# Patient Record
Sex: Male | Born: 1960 | Race: White | Hispanic: No | Marital: Single | Smoking: Former smoker
Health system: Southern US, Community
[De-identification: ages and names within clinical notes are randomized; demographics above are authoritative.]

## PROBLEM LIST (undated history)

## (undated) DIAGNOSIS — M1990 Unspecified osteoarthritis, unspecified site: Secondary | ICD-10-CM

## (undated) DIAGNOSIS — J45909 Unspecified asthma, uncomplicated: Secondary | ICD-10-CM

## (undated) DIAGNOSIS — M5412 Radiculopathy, cervical region: Secondary | ICD-10-CM

## (undated) DIAGNOSIS — I10 Essential (primary) hypertension: Secondary | ICD-10-CM

## (undated) DIAGNOSIS — F32A Depression, unspecified: Secondary | ICD-10-CM

## (undated) DIAGNOSIS — E669 Obesity, unspecified: Secondary | ICD-10-CM

## (undated) HISTORY — DX: Unspecified osteoarthritis, unspecified site: M19.90

## (undated) HISTORY — PX: EPIDURAL BLOCK INJECTION: SHX1516

## (undated) HISTORY — DX: Essential (primary) hypertension: I10

## (undated) HISTORY — PX: APPENDECTOMY: SHX54

## (undated) HISTORY — DX: Unspecified asthma, uncomplicated: J45.909

## (undated) HISTORY — DX: Depression, unspecified: F32.A

---

## 2011-08-31 DIAGNOSIS — M25561 Pain in right knee: Secondary | ICD-10-CM | POA: Insufficient documentation

## 2017-10-27 ENCOUNTER — Ambulatory Visit (INDEPENDENT_AMBULATORY_CARE_PROVIDER_SITE_OTHER): Payer: Commercial Managed Care - PPO

## 2017-10-27 ENCOUNTER — Other Ambulatory Visit: Payer: Self-pay

## 2017-10-27 ENCOUNTER — Encounter: Payer: Self-pay | Admitting: Emergency Medicine

## 2017-10-27 ENCOUNTER — Ambulatory Visit
Admission: EM | Admit: 2017-10-27 | Discharge: 2017-10-27 | Disposition: A | Discharge status: Home / Self Care | Payer: Commercial Managed Care - PPO | Attending: Emergency Medicine | Admitting: Emergency Medicine

## 2017-10-27 DIAGNOSIS — N12 Tubulo-interstitial nephritis, not specified as acute or chronic: Secondary | ICD-10-CM | POA: Insufficient documentation

## 2017-10-27 DIAGNOSIS — R531 Weakness: Secondary | ICD-10-CM | POA: Insufficient documentation

## 2017-10-27 DIAGNOSIS — B349 Viral infection, unspecified: Secondary | ICD-10-CM | POA: Diagnosis not present

## 2017-10-27 DIAGNOSIS — Z87442 Personal history of urinary calculi: Secondary | ICD-10-CM | POA: Insufficient documentation

## 2017-10-27 DIAGNOSIS — R062 Wheezing: Secondary | ICD-10-CM | POA: Insufficient documentation

## 2017-10-27 DIAGNOSIS — Z79899 Other long term (current) drug therapy: Secondary | ICD-10-CM | POA: Diagnosis not present

## 2017-10-27 DIAGNOSIS — M545 Low back pain: Secondary | ICD-10-CM | POA: Diagnosis not present

## 2017-10-27 DIAGNOSIS — I493 Ventricular premature depolarization: Secondary | ICD-10-CM | POA: Diagnosis not present

## 2017-10-27 DIAGNOSIS — R42 Dizziness and giddiness: Secondary | ICD-10-CM | POA: Insufficient documentation

## 2017-10-27 DIAGNOSIS — Z809 Family history of malignant neoplasm, unspecified: Secondary | ICD-10-CM | POA: Insufficient documentation

## 2017-10-27 DIAGNOSIS — F17210 Nicotine dependence, cigarettes, uncomplicated: Secondary | ICD-10-CM | POA: Insufficient documentation

## 2017-10-27 DIAGNOSIS — Z791 Long term (current) use of non-steroidal anti-inflammatories (NSAID): Secondary | ICD-10-CM | POA: Insufficient documentation

## 2017-10-27 DIAGNOSIS — M791 Myalgia, unspecified site: Secondary | ICD-10-CM | POA: Diagnosis not present

## 2017-10-27 DIAGNOSIS — I700 Atherosclerosis of aorta: Secondary | ICD-10-CM | POA: Diagnosis not present

## 2017-10-27 DIAGNOSIS — R110 Nausea: Secondary | ICD-10-CM | POA: Diagnosis not present

## 2017-10-27 DIAGNOSIS — R0602 Shortness of breath: Secondary | ICD-10-CM | POA: Diagnosis not present

## 2017-10-27 DIAGNOSIS — R0981 Nasal congestion: Secondary | ICD-10-CM | POA: Insufficient documentation

## 2017-10-27 DIAGNOSIS — R002 Palpitations: Secondary | ICD-10-CM | POA: Insufficient documentation

## 2017-10-27 DIAGNOSIS — R079 Chest pain, unspecified: Secondary | ICD-10-CM | POA: Diagnosis not present

## 2017-10-27 DIAGNOSIS — Z87440 Personal history of urinary (tract) infections: Secondary | ICD-10-CM | POA: Insufficient documentation

## 2017-10-27 DIAGNOSIS — R61 Generalized hyperhidrosis: Secondary | ICD-10-CM | POA: Diagnosis not present

## 2017-10-27 DIAGNOSIS — R05 Cough: Secondary | ICD-10-CM | POA: Diagnosis not present

## 2017-10-27 LAB — URINALYSIS, COMPLETE (UACMP) WITH MICROSCOPIC
BILIRUBIN URINE: NEGATIVE
GLUCOSE, UA: NEGATIVE mg/dL
HGB URINE DIPSTICK: NEGATIVE
Leukocytes, UA: NEGATIVE
NITRITE: NEGATIVE
PROTEIN: 100 mg/dL — AB
RBC / HPF: NONE SEEN RBC/hpf (ref 0–5)
Specific Gravity, Urine: 1.02 (ref 1.005–1.030)
pH: 5.5 (ref 5.0–8.0)

## 2017-10-27 LAB — COMPREHENSIVE METABOLIC PANEL
ALT: 22 U/L (ref 0–44)
AST: 27 U/L (ref 15–41)
Albumin: 4.5 g/dL (ref 3.5–5.0)
Alkaline Phosphatase: 63 U/L (ref 38–126)
Anion gap: 15 (ref 5–15)
BILIRUBIN TOTAL: 1.3 mg/dL — AB (ref 0.3–1.2)
BUN: 17 mg/dL (ref 6–20)
CO2: 23 mmol/L (ref 22–32)
CREATININE: 1.09 mg/dL (ref 0.61–1.24)
Calcium: 10 mg/dL (ref 8.9–10.3)
Chloride: 102 mmol/L (ref 98–111)
GFR calc Af Amer: >60 mL/min (ref >60)
Glucose, Bld: 126 mg/dL — ABNORMAL HIGH (ref 70–99)
POTASSIUM: 4.4 mmol/L (ref 3.5–5.1)
Sodium: 140 mmol/L (ref 135–145)
Total Protein: 7.7 g/dL (ref 6.5–8.1)

## 2017-10-27 LAB — CBC WITH DIFFERENTIAL/PLATELET
BASOS ABS: 0.1 10*3/uL (ref 0–0.1)
Basophils Relative: 1 %
EOS ABS: 0.1 10*3/uL (ref 0–0.7)
EOS PCT: 1 %
HCT: 49.6 % (ref 40.0–52.0)
Hemoglobin: 16.7 g/dL (ref 13.0–18.0)
LYMPHS ABS: 1.5 10*3/uL (ref 1.0–3.6)
LYMPHS PCT: 23 %
MCH: 33.9 pg (ref 26.0–34.0)
MCHC: 33.6 g/dL (ref 32.0–36.0)
MCV: 100.7 fL — ABNORMAL HIGH (ref 80.0–100.0)
MONO ABS: 0.4 10*3/uL (ref 0.2–1.0)
Monocytes Relative: 7 %
Neutro Abs: 4.4 10*3/uL (ref 1.4–6.5)
Neutrophils Relative %: 68 %
PLATELETS: 209 10*3/uL (ref 150–440)
RBC: 4.93 MIL/uL (ref 4.40–5.90)
RDW: 14.7 % — AB (ref 11.5–14.5)
WBC: 6.4 10*3/uL (ref 3.8–10.6)

## 2017-10-27 IMAGING — CR DG CHEST 2V
2 series · 2 of 2 positions shown · non-contrast
Comparison: None in PACs

CLINICAL DATA: Shortness of breath at rest and with exertion,
nonproductive cough, body aches, fever, nausea, and left-sided chest
pain for the past 3 days. Current smoker.

EXAM:
CHEST - 2 VIEW

[chest pa]
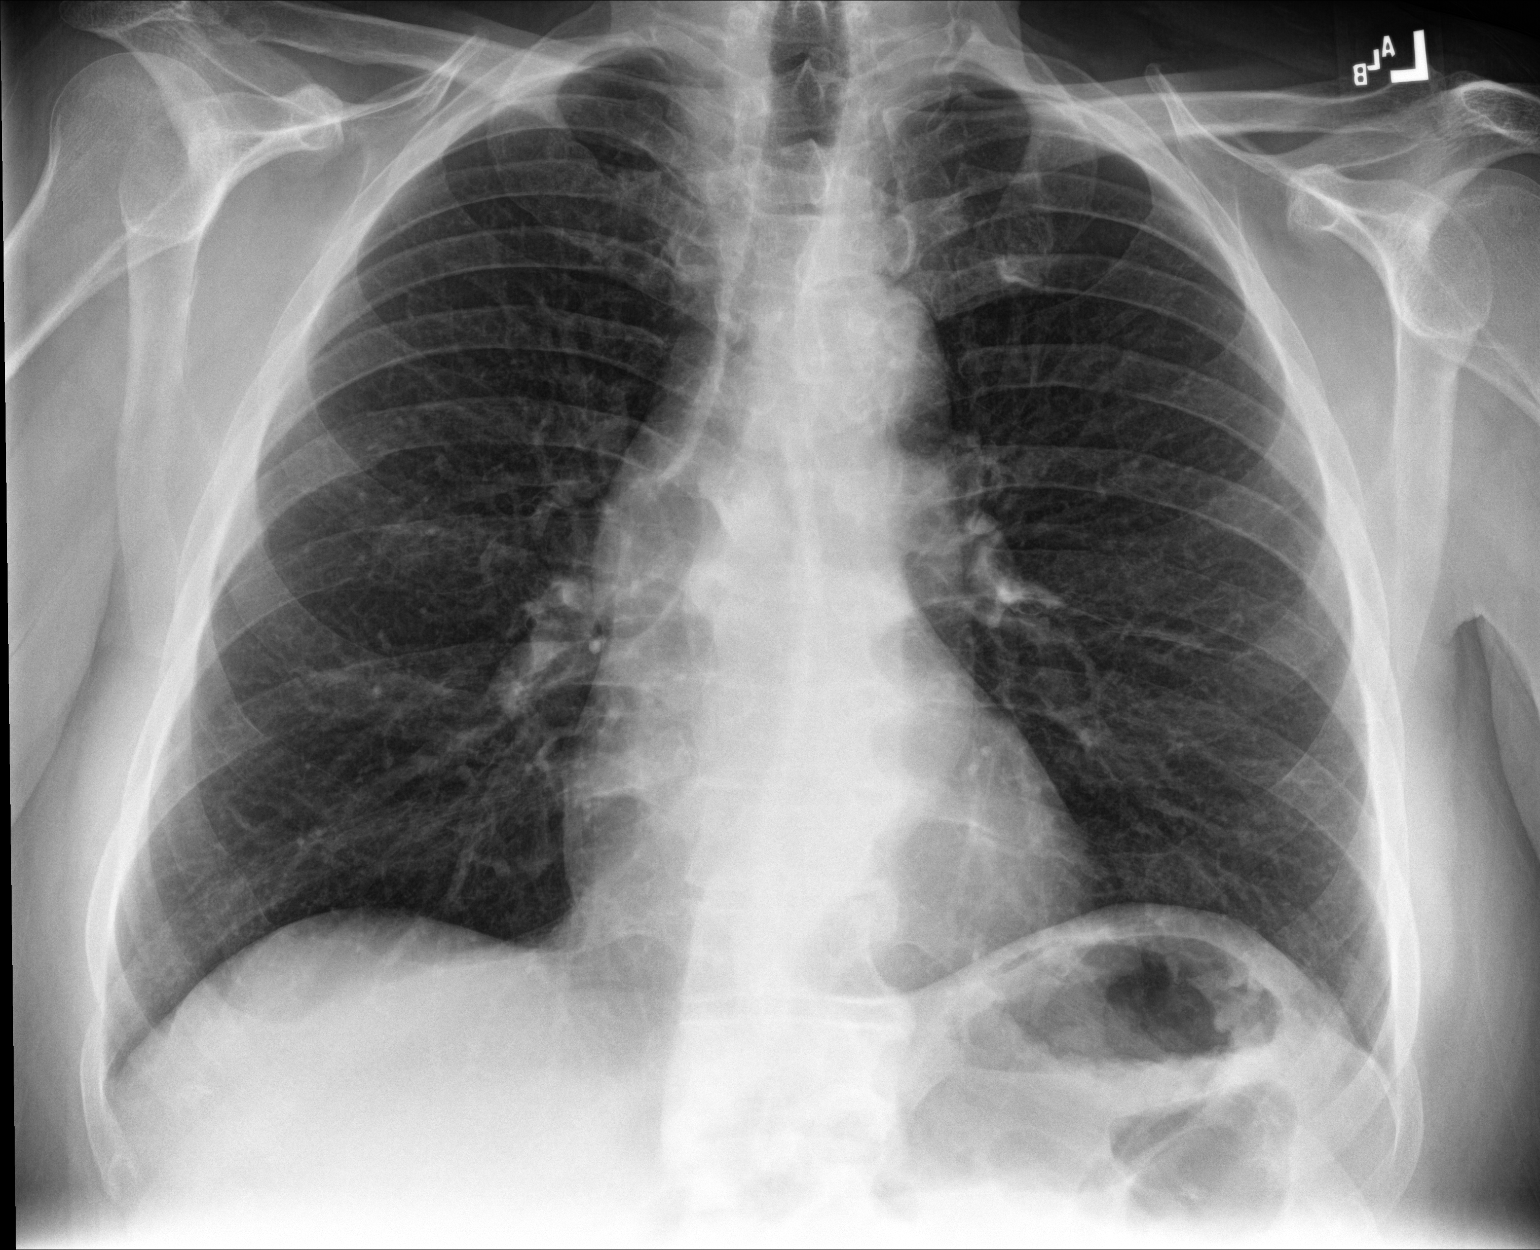

[chest lat]
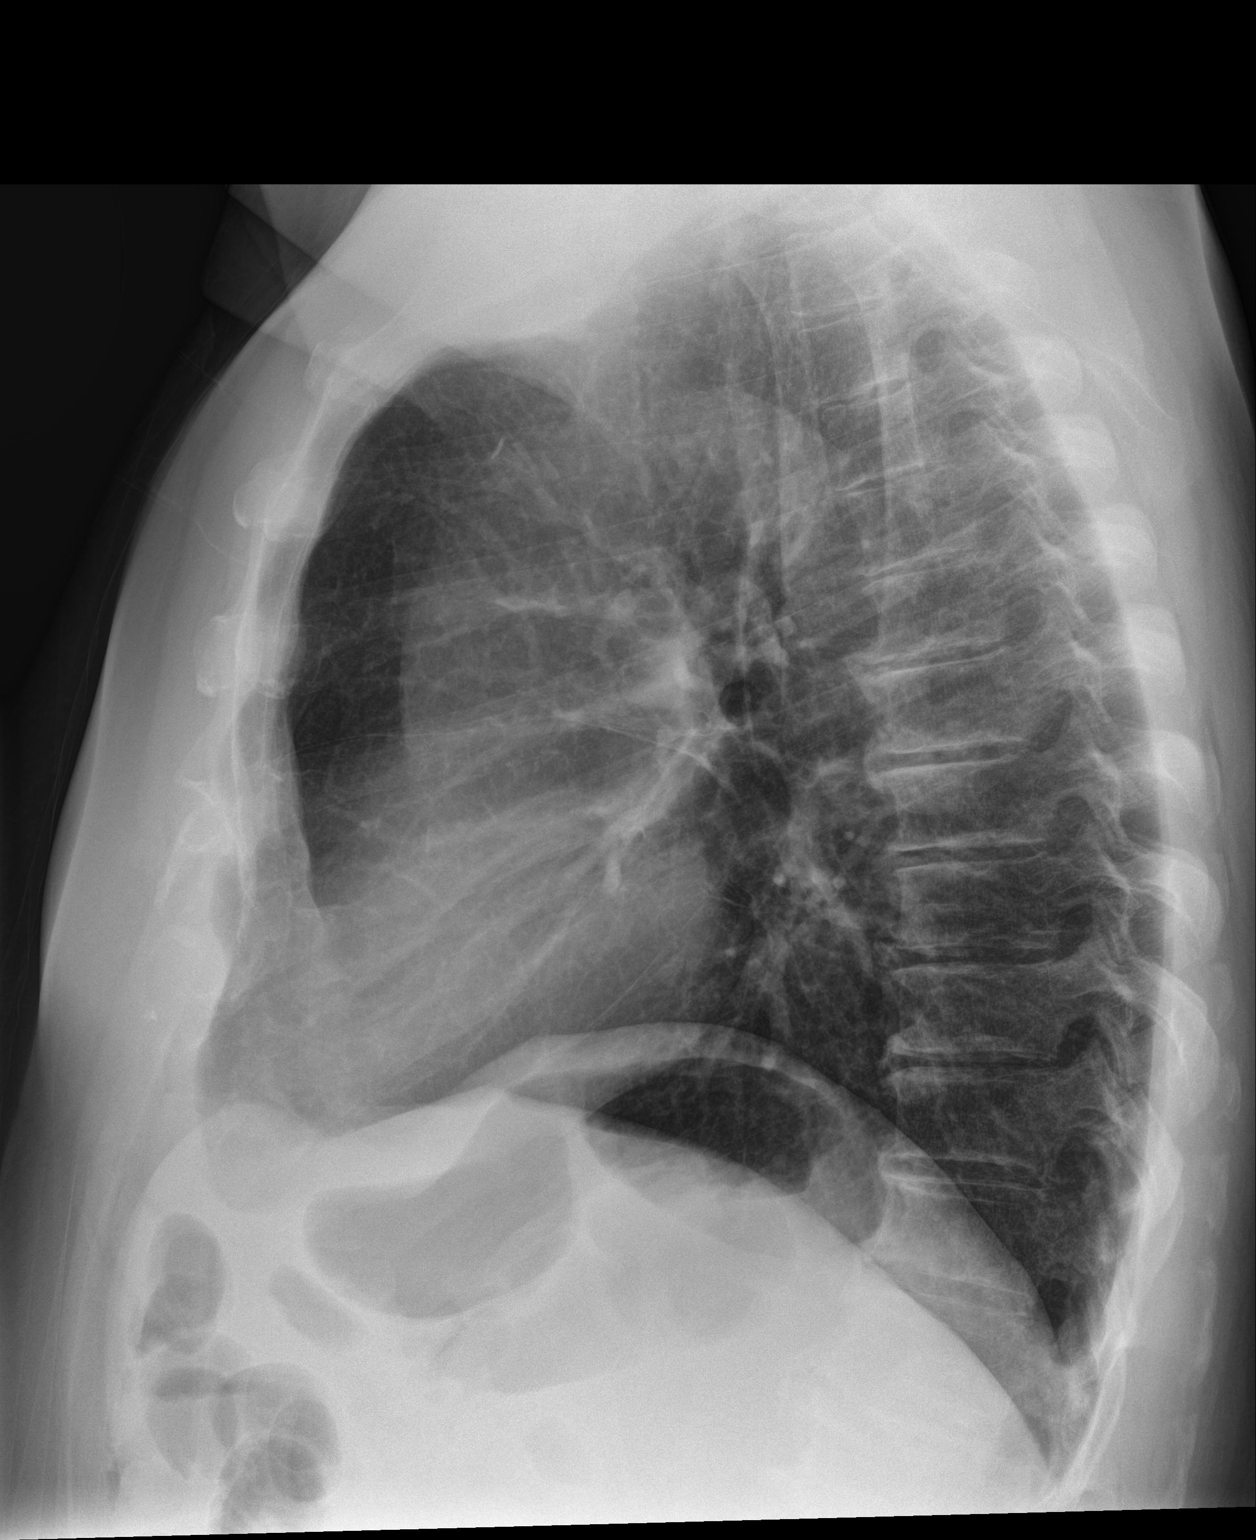

[2 of 2 positions shown; findings below may reference images not displayed]

FINDINGS: The lungs are adequately inflated and clear. The heart and pulmonary
vascularity are normal. The mediastinum is normal in width. There is
no pleural effusion. There is calcification in the wall of the
aortic arch. The bony thorax exhibits no acute abnormality.
IMPRESSION: There is no acute cardiopulmonary abnormality.

Thoracic aortic atherosclerosis.

## 2017-10-27 MED ORDER — IBUPROFEN 600 MG PO TABS: 600.0000 mg | ORAL_TABLET | Freq: Four times a day (QID) | ORAL | 0 refills | Status: DC | PRN

## 2017-10-27 MED ORDER — ALBUTEROL SULFATE HFA 108 (90 BASE) MCG/ACT IN AERS: 1.0000 | INHALATION_SPRAY | Freq: Four times a day (QID) | RESPIRATORY_TRACT | 0 refills | Status: AC | PRN

## 2017-10-27 MED ORDER — AEROCHAMBER PLUS MISC: 2 refills | Status: AC

## 2017-10-27 NOTE — Discharge Instructions (Addendum)
Your EKG, chest x-ray, lab work came back normal.  We are sending your urine off for culture to make absolutely sure that you do not have a urinary tract infection.  We will call in the appropriate antibiotics if you do have a UTI.  Ibuprofen 600 mg with 1 g of Tylenol together 3 or 4 times a day as needed for body aches.  Push electrolyte containing fluids such as Pedialyte or Gatorade.  You can try 2 puffs from your albuterol inhaler using your spacer every 4-6 hours as needed for coughing, shortness of breath.  Decrease your salt intake. diet and exercise will lower your blood pressure significantly. It is important to keep your blood pressure under good control, as having a elevated blood pressure for prolonged periods of time significantly increases your risk of stroke, heart attacks, kidney damage, eye damage, and other problems. Measure your blood pressure once a day, preferably at the same time every day. Keep a log of this and bring it to your next doctor's appointment.  Bring your blood pressure cuff as well.  Return here in a week or two for blood pressure recheck if you're unable to find a primary care physician by then. Return immediately to the ER if you start having chest pain, headache, problems seeing, problems talking, problems walking, if you feel like you're about to pass out, if you do pass out, if you have a seizure, or for any other concerns.  Here is a list of primary care providers who are taking new patients:  Dr. Elizabeth Sauereanna Jones, Dr. Schuyler AmorWilliam Plonk [address] [place] [address] [phone]  [facility] [place] [address]  [place] [address]  [phone]  [facility] [address]  [place], [place] [address] [phone]  [facility] [place] [address]  [phone] [place], [place] [address]  Here are clinics/ other resources who will see you if you do not have insurance. Some have certain criteria that you must meet. Call  them and find out what they are:  [facility]: [address] [place], [place] [address] Phone: [phone] Hours: First and Third Saturdays of each Month, 9 a.m. - 1 p.m.  [facility]: [address], [place], [place] [address] Phone: [phone] Hours: Tuesday, 4 p.m. - 8 p.m. Thursday, 1 p.m. - 8 p.m. Wednesday, 9 a.m. - [name]  [facility] [address], [place], [place] [address] Phone: [phone] Pharmacy Phone Number: [phone] Dental Phone Number: [phone] [facility] Insurance Help: [phone]  Dental Hours: Monday - Thursday, 8 a.m. - 6 p.m.  [facility] [address] [place], [place] [address] Phone: [phone] Pharmacy Phone Number: [phone] [facility] Insurance Help: [phone]  [facility] [address] [place], [place] [address] Phone: [phone] Pharmacy Phone Number: [phone] [facility] Insurance Help: [phone]  [facility] [address] [place], [place] [address] Phone: [phone] [facility] Insurance Help: [phone]   [facility] [address] [place], [place] [address] Phone: [phone]  Go to www.goodrx.com to look up your medications. This will give you a list of where you can find your prescriptions at the most affordable prices. Or ask the pharmacist what the cash price is, or if they have any other discount programs available to help make your medication more affordable. This can be less expensive than what you would pay with insurance.

## 2017-10-27 NOTE — ED Provider Notes (Signed)
HPI  SUBJECTIVE:  Keith Rogers is a 57 y.o. male who presents with 2 days of generalized weakness, left-sided entire body aches described as soreness, diaphoresis.  He reports feeling feverish but has not taken his temperature.  Reports some nasal congestion/rhinorrhea, occasional dry cough, intermittent shortness of breath, dyspnea on exertion.  He reports constant left lower back pain described as soreness, intermittent episodes of dizziness, lightheadedness that seems to be associated with positional change.  States that this dizziness lasts a few minutes and then resolves.  He denies vertigo.  No sore throat, ear pain, sinus pain or pressure, neck stiffness, headache, rash, wheezing, chest pressure or heaviness.  He reports reports entire left sided chest soreness that seems to be part of his left-sided body aches.  No abdominal pain, urinary complaints.  No vomiting, diarrhea.  No tick bite, or spending time in the woods.  No sick contacts with similar symptoms.  No antibiotics in the past month.  He has been taking Aleve, cough syrup and over-the-counter cold medications with some improvement in his symptoms.  Last dose of Aleve was within 6 to 8 hours of evaluation.  No aggravating factors.  He has a past medical history of UTI, pyelonephritis, nephrolithiasis.  He is a smoker.  No history of diabetes, hypertension, asthma, eczema, COPD, PE, DVT, cancer.  PMD: None.  History reviewed. No pertinent past medical history.  History reviewed. No pertinent surgical history.  Family History  Problem Relation Age of Onset  . Healthy Mother   . Cancer Father        agent orange    Social History   Tobacco Use  . Smoking status: Current Every Day Smoker    Packs/day: 1.00    Types: Cigarettes  . Smokeless tobacco: Never Used  Substance Use Topics  . Alcohol use: Yes    Comment: occassionally  . Drug use: Never    No current facility-administered medications for this encounter.   Current  Outpatient Medications:  .  albuterol (PROVENTIL HFA;VENTOLIN HFA) 108 (90 Base) MCG/ACT inhaler, Inhale 1-2 puffs into the lungs every 6 (six) hours as needed for wheezing or shortness of breath., Disp: 1 Inhaler, Rfl: 0 .  ibuprofen (ADVIL,MOTRIN) 600 MG tablet, Take 1 tablet (600 mg total) by mouth every 6 (six) hours as needed., Disp: 30 tablet, Rfl: 0 .  Spacer/Aero-Holding Chambers (AEROCHAMBER PLUS) inhaler, Use as instructed, Disp: 1 each, Rfl: 2  No Known Allergies   ROS  As noted in HPI.   Physical Exam  BP (!) 162/105 (BP Location: Left Arm)   Pulse (!) 104   Temp 98.1 F (36.7 C) (Oral)   Resp 16   Ht 6' (1.829 m)   Wt 270 lb (122.5 kg)   SpO2 100%   BMI 36.62 kg/m   Constitutional: Well developed, well nourished, no acute distress Eyes: PERRL, EOMI, conjunctiva normal bilaterally HENT: Normocephalic, atraumatic,mucus membranes moist.  No nasal congestion.  Normal turbinates.  No sinus tenderness. Neck: No meningismus Respiratory: Clear to auscultation bilaterally, no rales, no rhonchi.  Wheezes bilateral bases.  No chest wall tenderness Cardiovascular: Regular tachycardia, no murmurs, no gallops, no rubs GI: Soft, nondistended, normal bowel sounds, nontender, no rebound, no guarding.  Negative Murphy, negative McBurney Back: Left-sided CVAT skin: No rash, skin intact Musculoskeletal: Trace bilateral edema lower extremities, calves symmetric, no tenderness, no deformities Neurologic: Alert & oriented x 3, CN II-XII grossly intact, no motor deficits, sensation grossly intact Psychiatric: Speech and behavior appropriate  ED Course   Medications - No data to display  Orders Placed This Encounter  Procedures  . Urine culture    Standing Status:   Standing    Number of Occurrences:   1  . DG Chest 2 View    Standing Status:   Standing    Number of Occurrences:   1    Order Specific Question:   Reason for Exam (SYMPTOM  OR DIAGNOSIS REQUIRED)    Answer:    r/o PNA, pulm edema, pleural effusion  . Comprehensive metabolic panel    Standing Status:   Standing    Number of Occurrences:   1  . Urinalysis, Complete w Microscopic    Standing Status:   Standing    Number of Occurrences:   1  . CBC with Differential    Standing Status:   Standing    Number of Occurrences:   1  . ED EKG    Standing Status:   Standing    Number of Occurrences:   1    Order Specific Question:   Reason for Exam    Answer:   Weakness  . EKG 12-Lead    Standing Status:   Standing    Number of Occurrences:   1   Results for orders placed or performed during the hospital encounter of 10/27/17 (from the past 24 hour(s))  Comprehensive metabolic panel     Status: Abnormal   Collection Time: 10/27/17  1:56 PM  Result Value Ref Range   Sodium 140 135 - 145 mmol/L   Potassium 4.4 3.5 - 5.1 mmol/L   Chloride 102 98 - 111 mmol/L   CO2 23 22 - 32 mmol/L   Glucose, Bld 126 (H) 70 - 99 mg/dL   BUN 17 6 - 20 mg/dL   Creatinine, Ser 4.09 0.61 - 1.24 mg/dL   Calcium 81.1 8.9 - 91.4 mg/dL   Total Protein 7.7 6.5 - 8.1 g/dL   Albumin 4.5 3.5 - 5.0 g/dL   AST 27 15 - 41 U/L   ALT 22 0 - 44 U/L   Alkaline Phosphatase 63 38 - 126 U/L   Total Bilirubin 1.3 (H) 0.3 - 1.2 mg/dL   GFR calc non Af Amer >60 >60 mL/min   GFR calc Af Amer >60 >60 mL/min   Anion gap 15 5 - 15  Urinalysis, Complete w Microscopic     Status: Abnormal   Collection Time: 10/27/17  1:56 PM  Result Value Ref Range   Color, Urine YELLOW YELLOW   APPearance HAZY (A) CLEAR   Specific Gravity, Urine 1.020 1.005 - 1.030   pH 5.5 5.0 - 8.0   Glucose, UA NEGATIVE NEGATIVE mg/dL   Hgb urine dipstick NEGATIVE NEGATIVE   Bilirubin Urine NEGATIVE NEGATIVE   Ketones, ur TRACE (A) NEGATIVE mg/dL   Protein, ur 782 (A) NEGATIVE mg/dL   Nitrite NEGATIVE NEGATIVE   Leukocytes, UA NEGATIVE NEGATIVE   Squamous Epithelial / LPF 0-5 0 - 5   WBC, UA 0-5 0 - 5 WBC/hpf   RBC / HPF NONE SEEN 0 - 5 RBC/hpf   Bacteria,  UA FEW (A) NONE SEEN   Mucus PRESENT    Hyaline Casts, UA PRESENT   CBC with Differential     Status: Abnormal   Collection Time: 10/27/17  1:56 PM  Result Value Ref Range   WBC 6.4 3.8 - 10.6 K/uL   RBC 4.93 4.40 - 5.90 MIL/uL   Hemoglobin 16.7 13.0 -  18.0 g/dL   HCT 95.649.6 21.340.0 - 08.652.0 %   MCV 100.7 (H) 80.0 - 100.0 fL   MCH 33.9 26.0 - 34.0 pg   MCHC 33.6 32.0 - 36.0 g/dL   RDW 57.814.7 (H) 46.911.5 - 62.914.5 %   Platelets 209 150 - 440 K/uL   Neutrophils Relative % 68 %   Neutro Abs 4.4 1.4 - 6.5 K/uL   Lymphocytes Relative 23 %   Lymphs Abs 1.5 1.0 - 3.6 K/uL   Monocytes Relative 7 %   Monocytes Absolute 0.4 0.2 - 1.0 K/uL   Eosinophils Relative 1 %   Eosinophils Absolute 0.1 0 - 0.7 K/uL   Basophils Relative 1 %   Basophils Absolute 0.1 0 - 0.1 K/uL   Dg Chest 2 View  Result Date: 10/27/2017 CLINICAL DATA:  Shortness of breath at rest and with exertion, nonproductive cough, body aches, fever, nausea, and left-sided chest pain for the past 3 days. Current smoker. EXAM: CHEST - 2 VIEW COMPARISON:  None in PACs FINDINGS: The lungs are adequately inflated and clear. The heart and pulmonary vascularity are normal. The mediastinum is normal in width. There is no pleural effusion. There is calcification in the wall of the aortic arch. The bony thorax exhibits no acute abnormality. IMPRESSION: There is no acute cardiopulmonary abnormality. Thoracic aortic atherosclerosis. Electronically Signed   By: David  Swazil[place] M.D.   On: 10/27/2017 13:50    ED Clinical Impression  Viral syndrome   ED Assessment/Plan  Given the CVA tenderness and history of pyelonephritis/UTIs, will check urine.  We will also check chest x-ray because of the wheezing in bilateral bases, history of intermittent shortness of breath/dyspnea on exertion.  He is a smoker.  Also checking CBC, CMP, EKG.  Doubt PE.  Blood pressure noted.  No previous measurements for comparison.  Pt denies any CNS type sx such as HA, visual  changes, focal paresis, or new onset seizure activity.  While patient reports chest soreness, intermittent palpitations, pt denies any CV sx such as pedal edema, tearing pain radiating to back or abd. Pt denied any renal sx such as anuria or hematuria.  Patient has been using OTC cold medications with decongestants could explain his elevated blood pressure.  No evidence of a hypertensive emergency.  EKG: Normal sinus rhythm with single PVC.  Rate 95.  Normal axis, normal intervals.  No hypertrophy.  No previous EKG for comparison.  Reviewed imaging independently.  No pneumonia.  Thoracic aortic atherosclerosis per radiology. See radiology report for full details.  Labs reviewed.  Patient has few bacteria in his urine, but negative esterase, nitrites.  We will send this off for culture prior to initiating treatment for possible UTI.  His CBC, CMP are unremarkable.  Unsure as to the etiology of patient's symptoms, suspect viral process.  No evidence of a life-threatening emergency at this time.  Will send him home with ibuprofen 600 mg to take with 1 g of Tylenol together 3 or 4 times a day.  Discontinue all other cold medicines.  May use guaifenesin as needed for nasal congestion.  Can try an albuterol inhaler with a spacer for the cough, dyspnea.  patient states that he is sleeping well at night, do not think that he requires narcotic cough syrup.  We will have him push fluids, and refer him to a primary care physician for further blood pressure monitoring.  Will advise him to keep a log of his blood pressure.  Discussed labs, imaging, MDM,  treatment plan, and plan for follow-up with patient Discussed sn/sx that should prompt return to the ED. patient agrees with plan.   Meds ordered this encounter  Medications  . ibuprofen (ADVIL,MOTRIN) 600 MG tablet    Sig: Take 1 tablet (600 mg total) by mouth every 6 (six) hours as needed.    Dispense:  30 tablet    Refill:  0  . albuterol (PROVENTIL  HFA;VENTOLIN HFA) 108 (90 Base) MCG/ACT inhaler    Sig: Inhale 1-2 puffs into the lungs every 6 (six) hours as needed for wheezing or shortness of breath.    Dispense:  1 Inhaler    Refill:  0  . Spacer/Aero-Holding Chambers (AEROCHAMBER PLUS) inhaler    Sig: Use as instructed    Dispense:  1 each    Refill:  2    *This clinic note was created using Dragon dictation software. Therefore, there may be occasional mistakes despite careful proofreading.  ?   Domenick Gong, MD 10/27/17 [phone]

## 2017-10-27 NOTE — ED Triage Notes (Signed)
Patient in today c/o 2 day history of body aches, non-productive cough, sweating, weakeness and feeling flush. Patient has some nausea 2 days ago. Patient has tried OTC cold medication without relief.

## 2017-10-28 LAB — URINE CULTURE: CULTURE: NO GROWTH

## 2018-03-07 DIAGNOSIS — I10 Essential (primary) hypertension: Secondary | ICD-10-CM | POA: Insufficient documentation

## 2018-03-07 DIAGNOSIS — M5442 Lumbago with sciatica, left side: Secondary | ICD-10-CM | POA: Insufficient documentation

## 2018-03-07 DIAGNOSIS — G8929 Other chronic pain: Secondary | ICD-10-CM | POA: Insufficient documentation

## 2018-03-07 DIAGNOSIS — I493 Ventricular premature depolarization: Secondary | ICD-10-CM | POA: Insufficient documentation

## 2018-03-07 DIAGNOSIS — M25541 Pain in joints of right hand: Secondary | ICD-10-CM | POA: Insufficient documentation

## 2018-04-21 DIAGNOSIS — R200 Anesthesia of skin: Secondary | ICD-10-CM | POA: Insufficient documentation

## 2018-04-21 DIAGNOSIS — R202 Paresthesia of skin: Secondary | ICD-10-CM | POA: Insufficient documentation

## 2018-05-06 DIAGNOSIS — M170 Bilateral primary osteoarthritis of knee: Secondary | ICD-10-CM | POA: Insufficient documentation

## 2018-05-06 DIAGNOSIS — M159 Polyosteoarthritis, unspecified: Secondary | ICD-10-CM | POA: Insufficient documentation

## 2019-08-28 ENCOUNTER — Ambulatory Visit: Payer: Commercial Managed Care - PPO | Attending: Internal Medicine

## 2019-09-06 DIAGNOSIS — F17200 Nicotine dependence, unspecified, uncomplicated: Secondary | ICD-10-CM | POA: Insufficient documentation

## 2019-09-19 DIAGNOSIS — K3533 Acute appendicitis with perforation and localized peritonitis, with abscess: Secondary | ICD-10-CM | POA: Insufficient documentation

## 2019-09-28 DIAGNOSIS — R300 Dysuria: Secondary | ICD-10-CM | POA: Insufficient documentation

## 2020-01-31 ENCOUNTER — Other Ambulatory Visit: Payer: Self-pay | Admitting: Orthopedic Surgery

## 2020-01-31 DIAGNOSIS — M4302 Spondylolysis, cervical region: Secondary | ICD-10-CM

## 2020-01-31 DIAGNOSIS — M12811 Other specific arthropathies, not elsewhere classified, right shoulder: Secondary | ICD-10-CM

## 2020-02-05 ENCOUNTER — Ambulatory Visit
Admission: RE | Admit: 2020-02-05 | Discharge: 2020-02-05 | Disposition: A | Discharge status: Home / Self Care | Payer: Commercial Managed Care - PPO | Source: Ambulatory Visit | Attending: Orthopedic Surgery | Admitting: Orthopedic Surgery

## 2020-02-05 ENCOUNTER — Other Ambulatory Visit: Payer: Self-pay

## 2020-02-05 DIAGNOSIS — M4302 Spondylolysis, cervical region: Secondary | ICD-10-CM

## 2020-02-05 DIAGNOSIS — M12811 Other specific arthropathies, not elsewhere classified, right shoulder: Secondary | ICD-10-CM | POA: Diagnosis present

## 2020-02-05 IMAGING — MR MR CERVICAL SPINE W/O CM
5 series · 38 of 48 positions shown · non-contrast
Comparison: [DATE] cervical spine radiographs report.

CLINICAL DATA: Posterior neck pain and stiffness. Progressive over
2 years.

EXAM:
MRI CERVICAL SPINE WITHOUT CONTRAST
TECHNIQUE: Multiplanar, multisequence MR imaging of the cervical spine was
performed. No intravenous contrast was administered.

[Series 5: T2 · sagittal · 3.0mm · 0.62mm/px · 8 of 15 slices shown (1 of 2)]
[im 1/15]
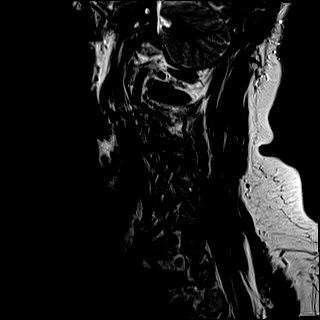
[im 3/15]
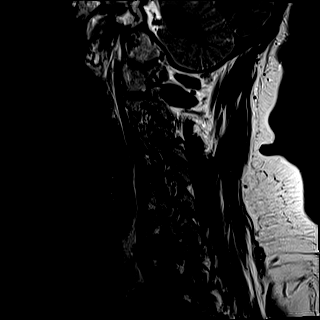
[im 5/15]
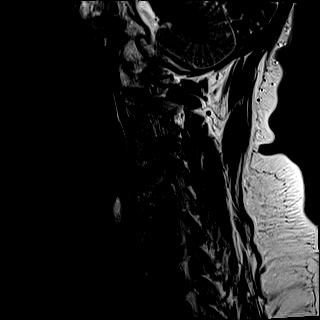
[im 7/15]
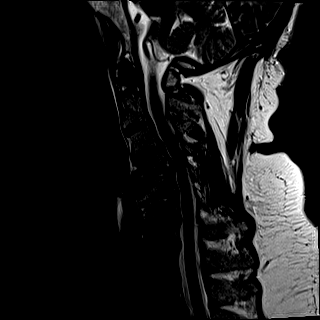
[im 9/15]
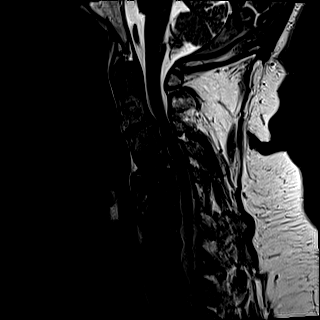
[im 11/15]
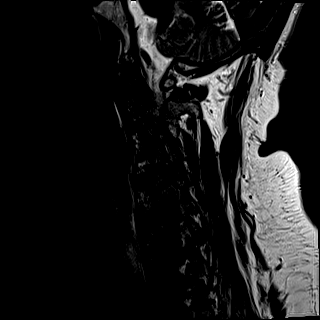
[im 13/15]
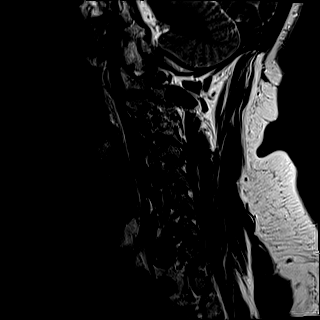
[im 15/15]
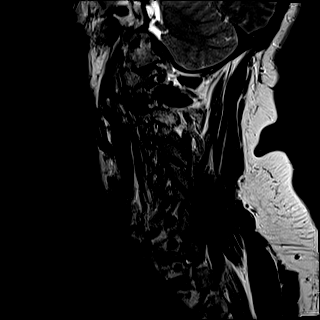

[Series 6: FLAIR · sagittal · 3.0mm · 0.78mm/px · 7 of 15 slices shown]
[im 1/15]
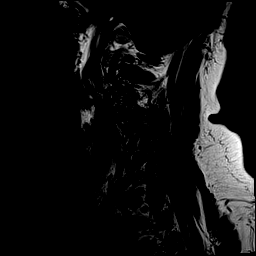
[im 3/15]
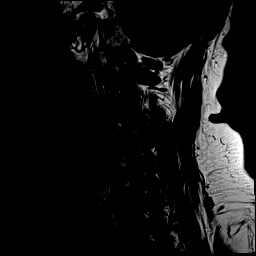
[im 5/15]
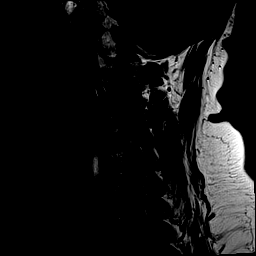
[im 8/15]
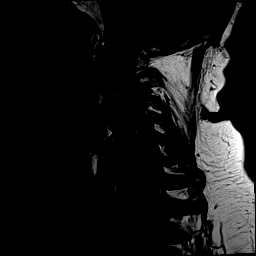
[im 10/15]
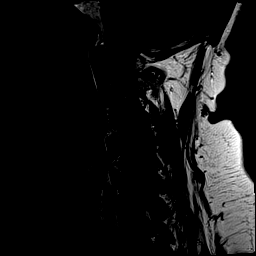
[im 12/15]
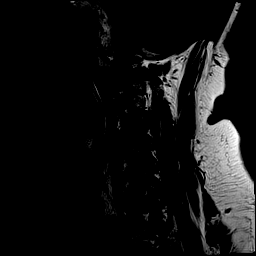
[im 15/15]
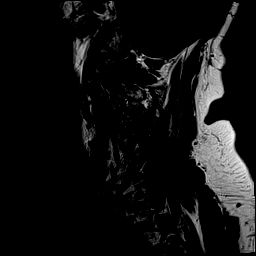

[Series 7: STIR · sagittal · 3.0mm · 0.62mm/px · 7 of 15 slices shown]
[im 1/15]
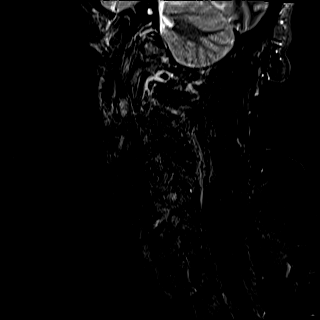
[im 3/15]
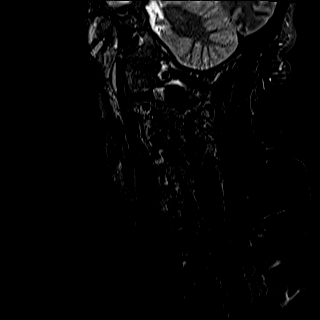
[im 5/15]
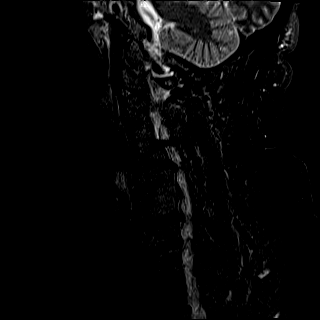
[im 8/15]
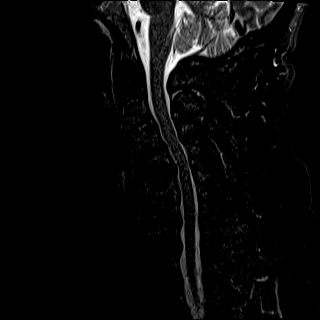
[im 10/15]
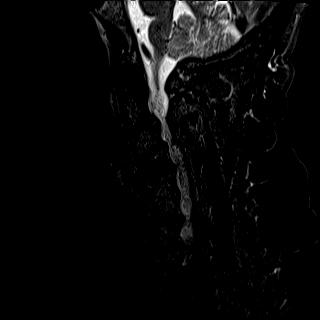
[im 12/15]
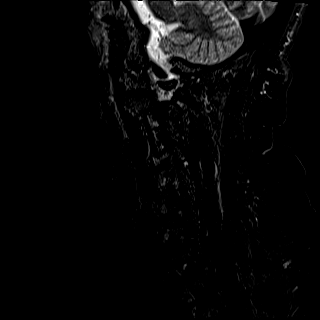
[im 15/15]
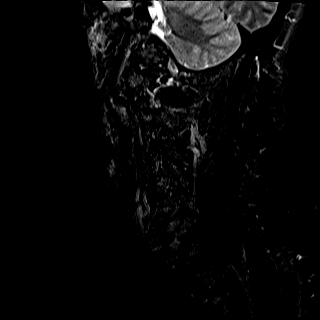

[Series 8: T2 · axial · 3.0mm · 0.70mm/px · z∈[-123,-32]mm · 9 of 27 slices shown (2 of 2)]
[im 1/27]
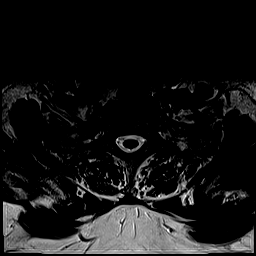
[im 5/27]
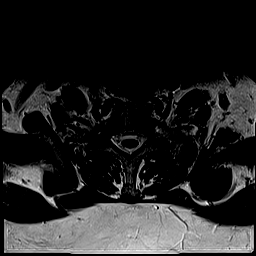
[im 9/27]
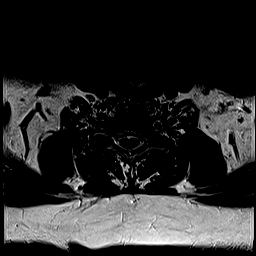
[im 11/27]
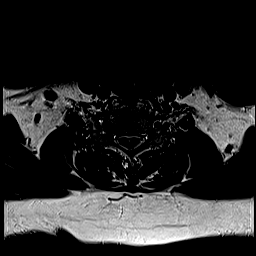
[im 14/27]
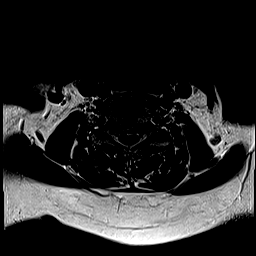
[im 16/27]
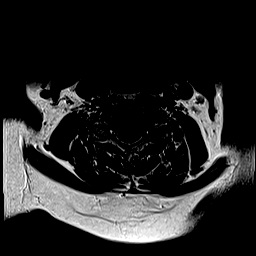
[im 18/27]
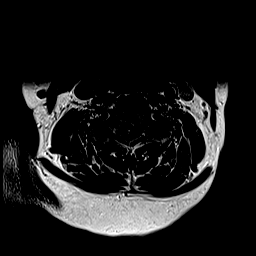
[im 22/27]
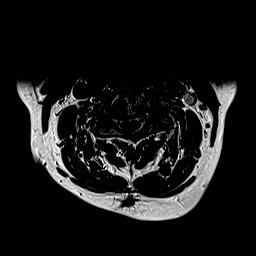
[im 27/27]
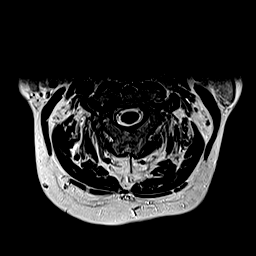

[Series 9: ax mpgr · axial · 3.0mm · 0.35mm/px · z∈[-123,-49]mm · 7 of 27 slices shown]
[im 1/27]
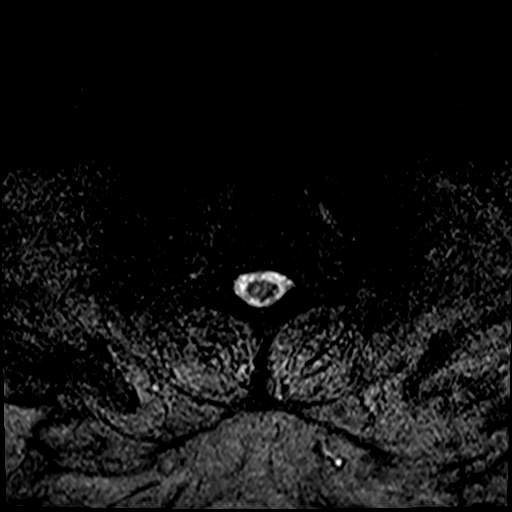
[im 5/27]
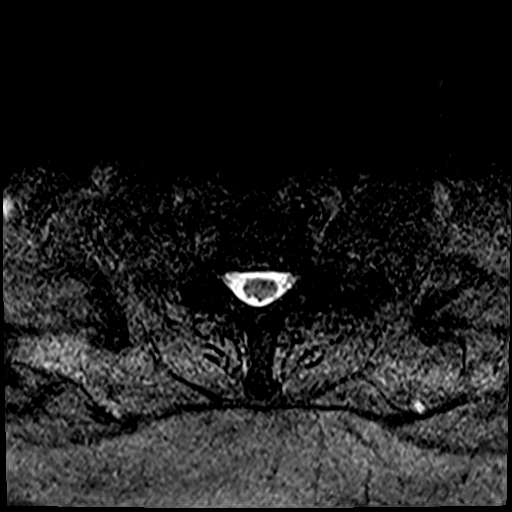
[im 9/27]
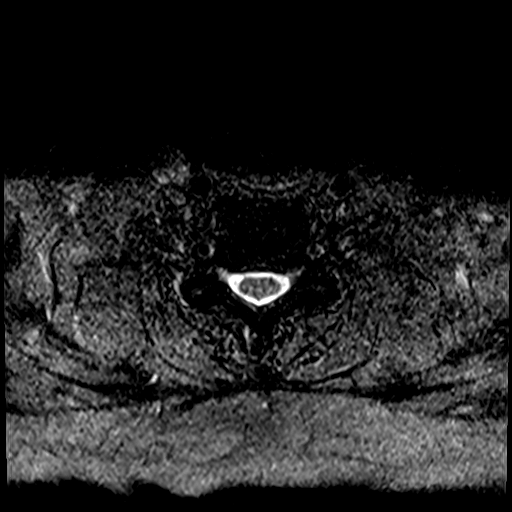
[im 11/27]
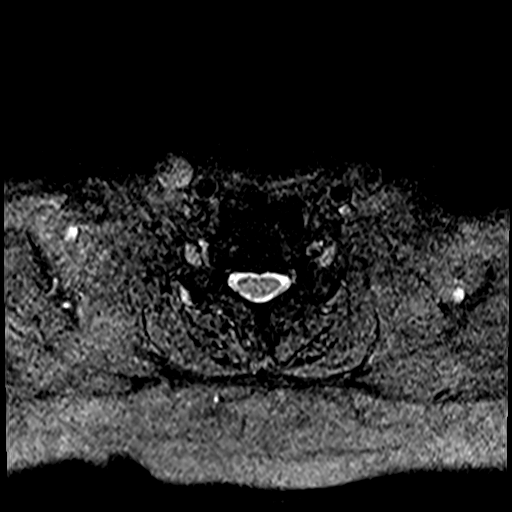
[im 16/27]
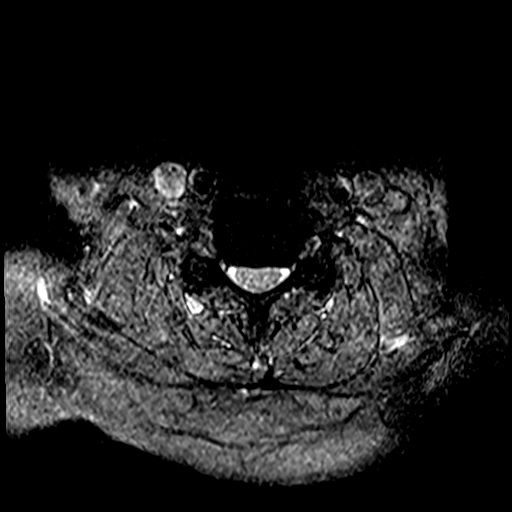
[im 18/27]
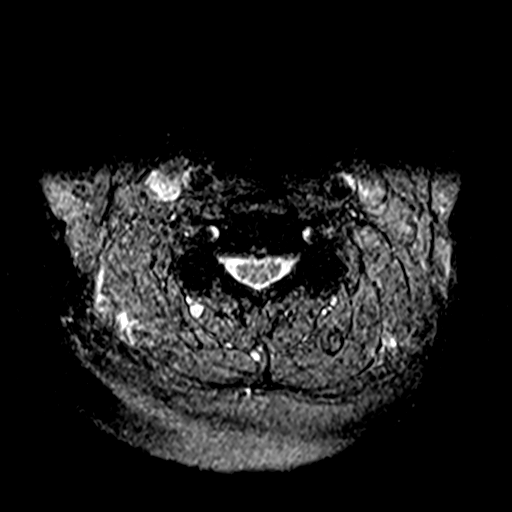
[im 22/27]
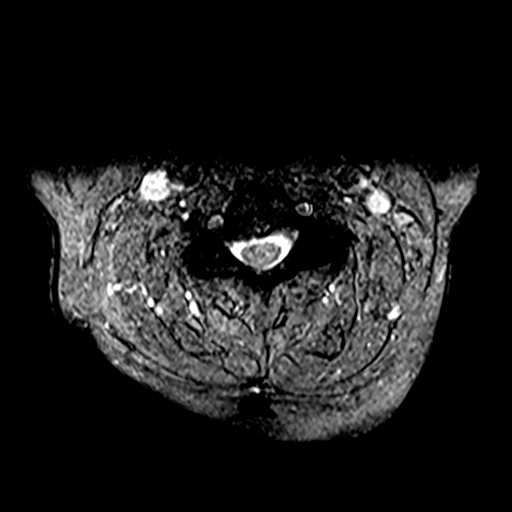

[38 of 48 positions shown; findings below may reference images not displayed]

FINDINGS: Alignment: Reversal of cervical lordosis. Grade 1 C2-3, C3-4
anterolisthesis.

Vertebrae: C4-5 Modic type 2 endplate degenerative changes with
prominent anterior osteophytosis. Partial fusion of the C5-6
vertebral bodies. Vertebral body heights are preserved.

Cord: Normal signal and morphology.

Posterior Fossa, vertebral arteries: Negative.

Disc levels: Multilevel desiccation and disc space loss most
prominent at the C4-6 levels.

C2-3: Disc osteophyte complex. Prominent right subarticular
osteophyte abutting the ventral cord. Uncovertebral and bilateral
facet degenerative spurring. Mild spinal canal, moderate right and
mild left neural foraminal narrowing.

C3-4: Disc osteophyte complex with superimposed central protrusion,
uncovertebral and facet hypertrophy. Mild spinal canal, mild right
and moderate to severe left neural foraminal narrowing.

C4-5: Disc osteophyte complex with uncovertebral and facet
hypertrophy. Mild spinal canal and bilateral neural foraminal
narrowing.

C5-6: Bilateral uncovertebral and facet degenerative spurring.
Patent spinal canal. Mild bilateral neural foraminal narrowing.

C6-7: Small disc osteophyte complex with small central protrusion
and uncovertebral hypertrophy. Patent spinal canal. Mild bilateral
neural foraminal narrowing.

C7-T1: Disc osteophyte complex with superimposed right foraminal
protrusion. Uncovertebral and facet hypertrophy. Partial effacement
of the ventral CSF containing spaces. Mild bilateral neural
foraminal narrowing.

Paraspinal tissues: Negative.
IMPRESSION: Multilevel spondylosis with reversal of lordosis. Bulky C4-5
anterior osteophytosis.

Moderate to severe right C2-3 and left C3-4 neural foraminal
narrowing.

Mild spinal canal narrowing at the C2-5 levels.

Mild left C2-3, right C3-4 and bilateral C4-T1 neural foraminal
narrowing.

## 2020-02-06 ENCOUNTER — Encounter: Payer: Self-pay | Admitting: Student in an Organized Health Care Education/Training Program

## 2020-02-06 ENCOUNTER — Ambulatory Visit
Payer: Commercial Managed Care - PPO | Attending: Student in an Organized Health Care Education/Training Program | Admitting: Student in an Organized Health Care Education/Training Program

## 2020-02-06 VITALS — BP 134/84 | HR 78 | Temp 97.0°F | Resp 16 | Ht 72.0 in | Wt 303.6 lb

## 2020-02-06 DIAGNOSIS — M542 Cervicalgia: Secondary | ICD-10-CM | POA: Insufficient documentation

## 2020-02-06 DIAGNOSIS — M5412 Radiculopathy, cervical region: Secondary | ICD-10-CM | POA: Diagnosis not present

## 2020-02-06 DIAGNOSIS — M5416 Radiculopathy, lumbar region: Secondary | ICD-10-CM | POA: Insufficient documentation

## 2020-02-06 DIAGNOSIS — G894 Chronic pain syndrome: Secondary | ICD-10-CM | POA: Diagnosis present

## 2020-02-06 DIAGNOSIS — M47812 Spondylosis without myelopathy or radiculopathy, cervical region: Secondary | ICD-10-CM | POA: Diagnosis not present

## 2020-02-06 DIAGNOSIS — M47816 Spondylosis without myelopathy or radiculopathy, lumbar region: Secondary | ICD-10-CM | POA: Diagnosis present

## 2020-02-06 DIAGNOSIS — M5136 Other intervertebral disc degeneration, lumbar region: Secondary | ICD-10-CM | POA: Insufficient documentation

## 2020-02-06 DIAGNOSIS — M51369 Other intervertebral disc degeneration, lumbar region without mention of lumbar back pain or lower extremity pain: Secondary | ICD-10-CM | POA: Insufficient documentation

## 2020-02-06 NOTE — Progress Notes (Signed)
Patient: Keith Rogers  Service Category: E/M  Provider: Gillis Santa, MD  DOB: Feb 09, 1961  DOS: 02/06/2020  Referring Provider: Quintin Alto, MD  MRN: [id number]  Setting: Ambulatory outpatient  PCP: Romualdo Bolk, FNP  Type: New Patient  Specialty: Interventional Pain Management    Location: Office  Delivery: Face-to-face     Primary Reason(s) for Visit: Encounter for initial evaluation of one or more chronic problems (new to examiner) potentially causing chronic pain, and posing a threat to normal musculoskeletal function. (Level of risk: High) CC: Neck Pain, Back Pain (upper, lower), Shoulder Pain (bilat, RIGHT is worse), Hand Pain (right always numb, feels asleep; pain increases with use), Hip Pain (bilat), and Knee Pain (bilat)  HPI  Mr. Keith Rogers is a 59 y.o. year old, male patient, who comes for the first time to our practice referred by Quintin Alto, MD for our initial evaluation of his chronic pain. He has Cervical radicular pain; Cervical spondylosis; Cervicalgia; Lumbar degenerative disc disease; Lumbar facet arthropathy; and Chronic pain syndrome on their problem list. Today he comes in for evaluation of his Neck Pain, Back Pain (upper, lower), Shoulder Pain (bilat, RIGHT is worse), Hand Pain (right always numb, feels asleep; pain increases with use), Hip Pain (bilat), and Knee Pain (bilat)  Pain Assessment: Location:   Neck Radiating: to upper back Onset: More than a month ago Duration: Chronic pain Quality: Aching, Burning, Sharp, Shooting Severity: 8 /10 (subjective, self-reported pain score)  Effect on ADL: limits daily activities Timing: Constant Modifying factors: meds take the edge off BP: 134/84  HR: 78  Onset and Duration: Sudden, Gradual and Present longer than 3 months Cause of pain: Arthritis Severity: Getting worse, NAS-11 at its worse: 9/10, NAS-11 at its best: 6/10, NAS-11 now: 7/10 and NAS-11 on the average: 8/10 Timing: During activity or exercise Aggravating  Factors: Kneeling, Lifiting, Motion, Prolonged sitting, Prolonged standing, Stooping , Twisting, Walking, Walking uphill, Walking downhill and Working Alleviating Factors: Bending, Hot packs, Lying down, Resting, Sitting and Sleeping Associated Problems: Depression, Dizziness, Fatigue, Inability to concentrate, Numbness, Spasms, Sweating, Tingling, Pain that wakes patient up and Pain that does not allow patient to sleep Quality of Pain: Aching, Agonizing, Annoying, Cruel, Deep, Disabling, Distressing, Exhausting, Getting longer, Nagging, Pressure-like, Pulsating, Sharp, Shooting, Splitting, Throbbing, Tingling, Tiring, Toothache-like and Work related Previous Examinations or Tests: X-rays and Orthopedic evaluation Previous Treatments: Epidural steroid injections, Narcotic medications, Physical Therapy, Stretching exercises and Trigger point injections   Keith Rogers is a pleasant 59 year old male who presents with a chief complaint of neck pain, right shoulder pain as well as intermittent right hand pain.  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  Patient also endorses lower back pain.  This is worse with prolonged standing and walking.  It does occasionally radiate to bilateral buttocks.  Of note patient had a cervical MRI completed yesterday.  He is not aware of the results at this time.  Of note he is undergoing physical therapy for his low back pain.  He is currently on gabapentin 300 mg 3 times a day, ibuprofen 600 mg as needed.  He is also prescribed tramadol by his primary care provider.  He is being referred here from Dr. Posey Pronto with rheumatology to consider interventional pain management for his cervical and lumbar pain generators.   Historic Controlled Substance Pharmacotherapy Review  PMP and historical list of controlled substances: 02/05/2020  2   11/22/2019  Tramadol-Acetaminophn 37.5-325  40.00  5 Eu Far  [id number]   JKD ([id number])   1/1  30.00 MME  Comm Ins   [place]        Historical Monitoring: The patient  reports no history of drug use. List of all UDS Test(s): No results found for: MDMA, COCAINSCRNUR, [place], [place], CANNABQUANT, [place], [place] List of other Serum/Urine Drug Screening Test(s):  No results found for: AMPHSCRSER, BARBSCRSER, BENZOSCRSER, COCAINSCRSER, COCAINSCRNUR, PCPSCRSER, PCPQUANT, THCSCRSER, THCU, CANNABQUANT, OPIATESCRSER, OXYSCRSER, PROPOXSCRSER, ETH Historical Background Evaluation: [place] PMP: PDMP not reviewed this encounter. Online review of the past 32-month period conducted.             [place] Department of public safety, offender search: Editor, commissioning Information) Non-contributory Risk Assessment Profile: Aberrant behavior: None observed or detected today Risk factors for fatal opioid overdose: None identified today Fatal overdose hazard ratio (HR): Calculation deferred Non-fatal overdose hazard ratio (HR): Calculation deferred Risk of opioid abuse or dependence: 0.7-3.0% with doses ? 36 MME/day and 6.1-26% with doses ? 120 MME/day. Substance use disorder (SUD) risk level: See below Personal History of Substance Abuse (SUD-Substance use disorder):  Alcohol: Negative  Illegal Drugs: Negative  Rx Drugs: Negative  ORT Risk Level calculation: Low Risk  Opioid Risk Tool - 02/06/20 1434      Family History of Substance Abuse   Alcohol Negative    Illegal Drugs Negative    Rx Drugs Negative      Personal History of Substance Abuse   Alcohol Negative    Illegal Drugs Negative    Rx Drugs Negative      Age   Age between 83-45 years  No      History of Preadolescent Sexual Abuse   History of Preadolescent Sexual Abuse Negative or Male      Psychological Disease   Psychological Disease Negative    Depression Positive      Total Score   Opioid Risk Tool Scoring 1    Opioid Risk Interpretation Low Risk          ORT Scoring interpretation table:  Score <3 = Low Risk for SUD  Score between 4-7 = Moderate Risk for SUD  Score >8 =  High Risk for Opioid Abuse   PHQ-2 Depression Scale:  Total score:    PHQ-2 Scoring interpretation table: (Score and probability of major depressive disorder)  Score 0 = No depression  Score 1 = 15.4% Probability  Score 2 = 21.1% Probability  Score 3 = 38.4% Probability  Score 4 = 45.5% Probability  Score 5 = 56.4% Probability  Score 6 = 78.6% Probability   PHQ-9 Depression Scale:  Total score:    PHQ-9 Scoring interpretation table:  Score 0-4 = No depression  Score 5-9 = Mild depression  Score 10-14 = Moderate depression  Score 15-19 = Moderately severe depression  Score 20-27 = Severe depression (2.4 times higher risk of SUD and 2.89 times higher risk of overuse)   Pharmacologic Plan: As per protocol, I have not taken over any controlled substance management, pending the results of ordered tests and/or consults.            Initial impression: Pending review of available data and ordered tests.  Meds   Current Outpatient Medications:  .  albuterol (PROVENTIL HFA;VENTOLIN HFA) 108 (90 Base) MCG/ACT inhaler, Inhale 1-2 puffs into the lungs every 6 (six) hours as needed for wheezing or shortness of breath., Disp: 1 Inhaler, Rfl: 0 .  diclofenac (CATAFLAM) 50 MG tablet, Take 50 mg by mouth 2 (two) times  daily., Disp: , Rfl:  .  escitalopram (LEXAPRO) 20 MG tablet, Take 20 mg by mouth daily., Disp: , Rfl:  .  gabapentin (NEURONTIN) 300 MG capsule, Take 300 mg by mouth 3 (three) times daily., Disp: , Rfl:  .  hydrochlorothiazide (HYDRODIURIL) 12.5 MG tablet, Take 12.5 mg by mouth in the morning and at bedtime., Disp: , Rfl:  .  ibuprofen (ADVIL,MOTRIN) 600 MG tablet, Take 1 tablet (600 mg total) by mouth every 6 (six) hours as needed., Disp: 30 tablet, Rfl: 0 .  propranolol (INDERAL) 20 MG tablet, Take 20 mg by mouth 2 (two) times daily., Disp: , Rfl:  .  Spacer/Aero-Holding Chambers (AEROCHAMBER PLUS) inhaler, Use as instructed, Disp: 1 each, Rfl: 2 .  tamsulosin (FLOMAX) 0.4 MG CAPS  capsule, Take 0.4 mg by mouth., Disp: , Rfl:  .  traMADol-acetaminophen (ULTRACET) 37.5-325 MG tablet, Take 1-2 tablets by mouth every 6 (six) hours as needed., Disp: , Rfl:   Imaging Review  Cervical Imaging: Cervical MR wo contrast: Results for orders placed during the hospital encounter of 02/05/20  MR CERVICAL SPINE WO CONTRAST  Narrative CLINICAL DATA:  Posterior neck pain and stiffness. Progressive over 2 years.  EXAM: MRI CERVICAL SPINE WITHOUT CONTRAST  TECHNIQUE: Multiplanar, multisequence MR imaging of the cervical spine was performed. No intravenous contrast was administered.  COMPARISON:  12/01/2019 cervical spine radiographs report.  FINDINGS: Alignment: Reversal of cervical lordosis. Grade 1 C2-3, C3-4 anterolisthesis.  Vertebrae: C4-5 Modic type 2 endplate degenerative changes with prominent anterior osteophytosis. Partial fusion of the C5-6 vertebral bodies. Vertebral body heights are preserved.  Cord: Normal signal and morphology.  Posterior Fossa, vertebral arteries: Negative.  Disc levels: Multilevel desiccation and disc space loss most prominent at the C4-6 levels.  C2-3: Disc osteophyte complex. Prominent right subarticular osteophyte abutting the ventral cord. Uncovertebral and bilateral facet degenerative spurring. Mild spinal canal, moderate right and mild left neural foraminal narrowing.  C3-4: Disc osteophyte complex with superimposed central protrusion, uncovertebral and facet hypertrophy. Mild spinal canal, mild right and moderate to severe left neural foraminal narrowing.  C4-5: Disc osteophyte complex with uncovertebral and facet hypertrophy. Mild spinal canal and bilateral neural foraminal narrowing.  C5-6: Bilateral uncovertebral and facet degenerative spurring. Patent spinal canal. Mild bilateral neural foraminal narrowing.  C6-7: Small disc osteophyte complex with small central protrusion and uncovertebral hypertrophy. Patent  spinal canal. Mild bilateral neural foraminal narrowing.  C7-T1: Disc osteophyte complex with superimposed right foraminal protrusion. Uncovertebral and facet hypertrophy. Partial effacement of the ventral CSF containing spaces. Mild bilateral neural foraminal narrowing.  Paraspinal tissues: Negative.  IMPRESSION: Multilevel spondylosis with reversal of lordosis. Bulky C4-5 anterior osteophytosis.  Moderate to severe right C2-3 and left C3-4 neural foraminal narrowing.  Mild spinal canal narrowing at the C2-5 levels.  Mild left C2-3, right C3-4 and bilateral C4-T1 neural foraminal narrowing.   Electronically Signed By: Primitivo Gauze M.D. On: 02/05/2020 13:36   Complexity Note: Imaging results reviewed. Results shared with Mr. Howton, using Layman's terms.                         ROS  Cardiovascular: High blood pressure Pulmonary or Respiratory: Wheezing and difficulty taking a deep full breath (Asthma) and Shortness of breath Neurological: No reported neurological signs or symptoms such as seizures, abnormal skin sensations, urinary and/or fecal incontinence, being born with an abnormal open spine and/or a tethered spinal cord Psychological-Psychiatric: Anxiousness, Depressed and Difficulty sleeping and or falling  asleep Gastrointestinal: No reported gastrointestinal signs or symptoms such as vomiting or evacuating blood, reflux, heartburn, alternating episodes of diarrhea and constipation, inflamed or scarred liver, or pancreas or irrregular and/or infrequent bowel movements Genitourinary: No reported renal or genitourinary signs or symptoms such as difficulty voiding or producing urine, peeing blood, non-functioning kidney, kidney stones, difficulty emptying the bladder, difficulty controlling the flow of urine, or chronic kidney disease Hematological: No reported hematological signs or symptoms such as prolonged bleeding, low or poor functioning platelets, bruising or  bleeding easily, hereditary bleeding problems, low energy levels due to low hemoglobin or being anemic Endocrine: No reported endocrine signs or symptoms such as high or low blood sugar, rapid heart rate due to high thyroid levels, obesity or weight gain due to slow thyroid or thyroid disease Rheumatologic: Rheumatoid arthritis Musculoskeletal: Negative for myasthenia gravis, muscular dystrophy, multiple sclerosis or malignant hyperthermia Work History: Disabled  Allergies  Mr. Plemons has No Known Allergies.  Laboratory Chemistry Profile   Renal Lab Results  Component Value Date   BUN 17 10/27/2017   CREATININE 1.09 10/27/2017   GFRAA >60 10/27/2017   GFRNONAA >60 10/27/2017   PROTEINUR 100 (A) 10/27/2017     Electrolytes Lab Results  Component Value Date   NA 140 10/27/2017   K 4.4 10/27/2017   CL 102 10/27/2017   CALCIUM 10.0 10/27/2017     Hepatic Lab Results  Component Value Date   AST 27 10/27/2017   ALT 22 10/27/2017   ALBUMIN 4.5 10/27/2017   ALKPHOS 63 10/27/2017     ID No results found for: LYMEIGGIGMAB, HIV, [place], STAPHAUREUS, MRSAPCR, HCVAB, PREGTESTUR, RMSFIGG, QFVRPH1IGG, QFVRPH2IGG, LYMEIGGIGMAB   Bone No results found for: VD25OH, OJ500XF8HWE, XH3716RC7, EL3810FB5, 25OHVITD1, 25OHVITD2, 25OHVITD3, TESTOFREE, TESTOSTERONE   Endocrine Lab Results  Component Value Date   GLUCOSE 126 (H) 10/27/2017   GLUCOSEU NEGATIVE 10/27/2017     Neuropathy No results found for: VITAMINB12, FOLATE, HGBA1C, HIV   CNS No results found for: COLORCSF, APPEARCSF, RBCCOUNTCSF, WBCCSF, POLYSCSF, LYMPHSCSF, EOSCSF, PROTEINCSF, GLUCCSF, JCVIRUS, CSFOLI, IGGCSF, LABACHR, ACETBL, LABACHR, ACETBL   Inflammation (CRP: Acute  ESR: Chronic) No results found for: CRP, ESRSEDRATE, LATICACIDVEN   Rheumatology No results found for: RF, ANA, LABURIC, URICUR, LYMEIGGIGMAB, LYMEABIGMQN, HLAB27   Coagulation Lab Results  Component Value Date   PLT 209 10/27/2017      Cardiovascular Lab Results  Component Value Date   HGB 16.7 10/27/2017   HCT 49.6 10/27/2017     Screening No results found for: SARSCOV2NAA, COVIDSOURCE, STAPHAUREUS, MRSAPCR, HCVAB, HIV, PREGTESTUR   Cancer No results found for: CEA, CA125, LABCA2   Allergens No results found for: ALMOND, APPLE, ASPARAGUS, AVOCADO, BANANA, BARLEY, BASIL, B[place], GREENBEAN, LIMABEAN, WHITEBEAN, BEEFIGE, REDBEET, BLUEBERRY, BROCCOLI, CABBAGE, MELON, CARROT, CASEIN, CASHEWNUT, CAULIFLOWER, CELERY     Note: Lab results reviewed.  PFSH  Drug: Mr. Flythe  reports no history of drug use. Alcohol:  reports current alcohol use. Tobacco:  reports that he quit smoking about 5 months ago. His smoking use included cigarettes. He smoked 1.00 pack per day. He has never used smokeless tobacco. Medical:  has a past medical history of Arthritis, Asthma, Depression, and Hypertension. Family: family history includes Cancer in his father; Healthy in his mother.  Past Surgical History:  Procedure Laterality Date  . APPENDECTOMY     Active Ambulatory Problems    Diagnosis Date Noted  . Cervical radicular pain 02/06/2020  . Cervical spondylosis 02/06/2020  . Cervicalgia 02/06/2020  . Lumbar degenerative  disc disease 02/06/2020  . Lumbar facet arthropathy 02/06/2020  . Chronic pain syndrome 02/06/2020   Resolved Ambulatory Problems    Diagnosis Date Noted  . No Resolved Ambulatory Problems   Past Medical History:  Diagnosis Date  . Arthritis   . Asthma   . Depression   . Hypertension    Constitutional Exam  General appearance: Well nourished, well developed, and well hydrated. In no apparent acute distress Vitals:   02/06/20 1419  BP: 134/84  Pulse: 78  Resp: 16  Temp: (!) 97 F (36.1 C)  TempSrc: Temporal  SpO2: 97%  Weight: (!) 303 lb 9.6 oz (137.7 kg)  Height: 6' (1.829 m)   BMI Assessment: Estimated body mass index is 41.18 kg/m as calculated from the following:   Height as of this  encounter: 6' (1.829 m).   Weight as of this encounter: 303 lb 9.6 oz (137.7 kg).  BMI interpretation table: BMI level Category Range association with higher incidence of chronic pain  <18 kg/m2 Underweight   18.5-24.9 kg/m2 Ideal body weight   25-29.9 kg/m2 Overweight Increased incidence by 20%  30-34.9 kg/m2 Obese (Class I) Increased incidence by 68%  35-39.9 kg/m2 Severe obesity (Class II) Increased incidence by 136%  >40 kg/m2 Extreme obesity (Class III) Increased incidence by 254%   Patient's current BMI Ideal Body weight  Body mass index is 41.18 kg/m. Ideal body weight: 77.6 kg (171 lb 1.2 oz) Adjusted ideal body weight: 101.6 kg (224 lb 1.4 oz)   BMI Readings from Last 4 Encounters:  02/06/20 41.18 kg/m  10/27/17 36.62 kg/m   Wt Readings from Last 4 Encounters:  02/06/20 (!) 303 lb 9.6 oz (137.7 kg)  10/27/17 270 lb (122.5 kg)    Psych/Mental status: Alert, oriented x 3 (person, place, & time)       Eyes: PERLA Respiratory: No evidence of acute respiratory distress  Cervical Spine Exam  Skin & Axial Inspection: No masses, redness, edema, swelling, or associated skin lesions Alignment: Symmetrical Functional ROM: Pain restricted ROM      Stability: No instability detected Muscle Tone/Strength: Functionally intact. No obvious neuro-muscular anomalies detected. Sensory (Neurological): Neurogenic pain pattern Palpation: No palpable anomalies             5 out of 5 strength bilateral upper extremity: Shoulder abduction, elbow flexion, elbow extension, thumb extension.  Upper Extremity (UE) Exam    Side: Right upper extremity  Side: Left upper extremity  Skin & Extremity Inspection: Skin color, temperature, and hair growth are WNL. No peripheral edema or cyanosis. No masses, redness, swelling, asymmetry, or associated skin lesions. No contractures.  Skin & Extremity Inspection: Skin color, temperature, and hair growth are WNL. No peripheral edema or cyanosis. No masses,  redness, swelling, asymmetry, or associated skin lesions. No contractures.  Functional ROM: Pain restricted ROM for shoulder  Functional ROM: Unrestricted ROM          Muscle Tone/Strength: Functionally intact. No obvious neuro-muscular anomalies detected.   Muscle Tone/Strength: Functionally intact. No obvious neuro-muscular anomalies detected.  Sensory (Neurological): Neurogenic pain pattern and arthropathic          Sensory (Neurological): Unimpaired          Palpation: No palpable anomalies              Palpation: No palpable anomalies              Provocative Test(s):  Phalen's test: deferred Tinel's test: deferred Apley's scratch test (touch opposite shoulder):  Action 1 (Across chest): Decreased ROM Action 2 (Overhead): Decreased ROM Action 3 (LB reach): Decreased ROM   Provocative Test(s):  Phalen's test: deferred Tinel's test: deferred Apley's scratch test (touch opposite shoulder):  Action 1 (Across chest): deferred Action 2 (Overhead): deferred Action 3 (LB reach): deferred    Thoracic Spine Area Exam  Skin & Axial Inspection: No masses, redness, or swelling Alignment: Symmetrical Functional ROM: Unrestricted ROM Stability: No instability detected Muscle Tone/Strength: Functionally intact. No obvious neuro-muscular anomalies detected. Sensory (Neurological): Unimpaired Muscle strength & Tone: No palpable anomalies  Lumbar Exam  Skin & Axial Inspection: No masses, redness, or swelling Alignment: Symmetrical Functional ROM: Pain restricted ROM       Stability: No instability detected Muscle Tone/Strength: Functionally intact. No obvious neuro-muscular anomalies detected. Sensory (Neurological): Musculoskeletal pain pattern Palpation: Complains of area being tender to palpation       Provocative Tests: Hyperextension/rotation test: (+) bilaterally for facet joint pain. Lumbar quadrant test (Kemp's test): deferred today       Lateral bending test: deferred today        Patrick's Maneuver: deferred today                   FABER* test: deferred today                   S-I anterior distraction/compression test: deferred today         S-I lateral compression test: deferred today         S-I Thigh-thrust test: deferred today         S-I Gaenslen's test: deferred today         *(Flexion, ABduction and External Rotation)  Gait & Posture Assessment  Ambulation: Unassisted Gait: Relatively normal for age and body habitus Posture: WNL   Lower Extremity Exam    Side: Right lower extremity  Side: Left lower extremity  Stability: No instability observed          Stability: No instability observed          Skin & Extremity Inspection: Skin color, temperature, and hair growth are WNL. No peripheral edema or cyanosis. No masses, redness, swelling, asymmetry, or associated skin lesions. No contractures.  Skin & Extremity Inspection: Skin color, temperature, and hair growth are WNL. No peripheral edema or cyanosis. No masses, redness, swelling, asymmetry, or associated skin lesions. No contractures.  Functional ROM: Unrestricted ROM                  Functional ROM: Unrestricted ROM                  Muscle Tone/Strength: Functionally intact. No obvious neuro-muscular anomalies detected.  Muscle Tone/Strength: Functionally intact. No obvious neuro-muscular anomalies detected.  Sensory (Neurological): Unimpaired        Sensory (Neurological): Unimpaired        DTR: Patellar: deferred today Achilles: deferred today Plantar: deferred today  DTR: Patellar: deferred today Achilles: deferred today Plantar: deferred today  Palpation: No palpable anomalies  Palpation: No palpable anomalies   Assessment  Primary Diagnosis & Pertinent Problem List: The primary encounter diagnosis was Cervical radicular pain. Diagnoses of Cervical facet joint syndrome, Cervical spondylosis, Cervicalgia, Lumbar degenerative disc disease, Lumbar facet arthropathy, and Chronic pain syndrome were  also pertinent to this visit.  Visit Diagnosis (New problems to examiner): 1. Cervical radicular pain   2. Cervical facet joint syndrome   3. Cervical spondylosis   4. Cervicalgia   5. Lumbar  degenerative disc disease   6. Lumbar facet arthropathy   7. Chronic pain syndrome    General Recommendations: The pain condition that the patient suffers from is best treated with a multidisciplinary approach that involves an increase in physical activity to prevent de-conditioning and worsening of the pain cycle, as well as psychological counseling (formal and/or informal) to address the co-morbid psychological affects of pain. Treatment will often involve judicious use of pain medications and interventional procedures to decrease the pain, allowing the patient to participate in the physical activity that will ultimately produce long-lasting pain reductions. The goal of the multidisciplinary approach is to return the patient to a higher level of overall function and to restore their ability to perform activities of daily living.  Plan of Care (Initial workup plan)  Note: Mr. Gee was reminded that as per protocol, today's visit has been an evaluation only. We have not taken over the patient's controlled substance management.  1.  Cervical radicular pain: Moderate to severe right C2-C3 and left C3-C4 neuroforaminal narrowing along with mild left C2-C3, right C3-C4 and bilateral C4-T1 neuroforaminal narrowing.  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  He is currently on gabapentin and engaging with physical therapy.  Recommend diagnostic cervical epidural steroid injection.  Risk and benefits reviewed patient would like to proceed.  2.  Cervical facet joint syndrome, cervical spondylolysis: Present on cervical MRI and clinical exam findings consistent with cervical facet joint syndrome.  Plan for cervical ESI first.  If not effective or partial benefit, recommend and/discussed diagnostic  cervical facet medial branch nerve blocks at C3, C4, C5, C6 bilaterally.  3.  Continue medication management with PCP.  If above interventional therapies are not as effective or if pain worsens, will have patient complete urine toxicology screen and psych assessment to be considered for chronic opioid therapy.  4.  Lumbar spine pain: Continue to work with physical therapy and continue NSAID and gabapentin therapy as prescribed.  After neck injections, can explore interventional options for lumbar spine.  Will likely need lumbar spine MRI after physical therapy.   Procedure Orders     Cervical Epidural Injection  Pharmacological management options:  Opioid Analgesics: The patient was informed that there is no guarantee that he would be a candidate for opioid analgesics. The decision will be made following CDC guidelines. This decision will be based on the results of diagnostic studies, as well as Mr. Eskelson risk profile.   Membrane stabilizer: Adequate regimen  Muscle relaxant: To be determined at a later time  NSAID: Adequate regimen  Other analgesic(s): To be determined at a later time   Interventional management options: Mr. Bromwell was informed that there is no guarantee that he would be a candidate for interventional therapies. The decision will be based on the results of diagnostic studies, as well as Mr. Vandervelden risk profile.  Procedure(s) under consideration:  Cervical epidural steroid injection Diagnostic cervical facet medial branch nerve blocks   Provider-requested follow-up: Return in about 1 week (around 02/13/2020) for C-ESI w/o PO Valium.  No future appointments.  Note by: Gillis Santa, MD Date: 02/06/2020; Time: 3:04 PM

## 2020-02-06 NOTE — Progress Notes (Signed)
Safety precautions to be maintained throughout the outpatient stay will include: orient to surroundings, keep bed in low position, maintain call bell within reach at all times, provide assistance with transfer out of bed and ambulation.  

## 2020-02-06 NOTE — Patient Instructions (Signed)
____________________________________________________________________________________________  Preparing for Procedure with Sedation  Procedure appointments are limited to planned procedures: . No Prescription Refills. . No disability issues will be discussed. . No medication changes will be discussed.  Instructions: . Oral Intake: Do not eat or drink anything for at least 8 hours prior to your procedure. (Exception: Blood Pressure Medication. See below.) . Transportation: Unless otherwise stated by your physician, you may drive yourself after the procedure. . Blood Pressure Medicine: Do not forget to take your blood pressure medicine with a sip of water the morning of the procedure. If your Diastolic (lower reading)is above 100 mmHg, elective cases will be cancelled/rescheduled. . Blood thinners: These will need to be stopped for procedures. Notify our staff if you are taking any blood thinners. Depending on which one you take, there will be specific instructions on how and when to stop it. . Diabetics on insulin: Notify the staff so that you can be scheduled 1st case in the morning. If your diabetes requires high dose insulin, take only  of your normal insulin dose the morning of the procedure and notify the staff that you have done so. . Preventing infections: Shower with an antibacterial soap the morning of your procedure. . Build-up your immune system: Take 1000 mg of Vitamin C with every meal (3 times a day) the day prior to your procedure. . Antibiotics: Inform the staff if you have a condition or reason that requires you to take antibiotics before dental procedures. . Pregnancy: If you are pregnant, call and cancel the procedure. . Sickness: If you have a cold, fever, or any active infections, call and cancel the procedure. . Arrival: You must be in the facility at least 30 minutes prior to your scheduled procedure. . Children: Do not bring children with you. . Dress appropriately:  Bring dark clothing that you would not mind if they get stained. . Valuables: Do not bring any jewelry or valuables.  Reasons to call and reschedule or cancel your procedure: (Following these recommendations will minimize the risk of a serious complication.) . Surgeries: Avoid having procedures within 2 weeks of any surgery. (Avoid for 2 weeks before or after any surgery). . Flu Shots: Avoid having procedures within 2 weeks of a flu shots or . (Avoid for 2 weeks before or after immunizations). . Barium: Avoid having a procedure within 7-10 days after having had a radiological study involving the use of radiological contrast. (Myelograms, Barium swallow or enema study). . Heart attacks: Avoid any elective procedures or surgeries for the initial 6 months after a "Myocardial Infarction" (Heart Attack). . Blood thinners: It is imperative that you stop these medications before procedures. Let us know if you if you take any blood thinner.  . Infection: Avoid procedures during or within two weeks of an infection (including chest colds or gastrointestinal problems). Symptoms associated with infections include: Localized redness, fever, chills, night sweats or profuse sweating, burning sensation when voiding, cough, congestion, stuffiness, runny nose, sore throat, diarrhea, nausea, vomiting, cold or Flu symptoms, recent or current infections. It is specially important if the infection is over the area that we intend to treat. . Heart and lung problems: Symptoms that may suggest an active cardiopulmonary problem include: cough, chest pain, breathing difficulties or shortness of breath, dizziness, ankle swelling, uncontrolled high or unusually low blood pressure, and/or palpitations. If you are experiencing any of these symptoms, cancel your procedure and contact your primary care physician for an evaluation.  Remember:  Regular Business hours are:    Monday to Thursday 8:00 AM to 4:00 PM  Provider's  Schedule: Francisco Naveira, MD:  Procedure days: Tuesday and Thursday 7:30 AM to 4:00 PM  Bilal Lateef, MD:  Procedure days: Monday and Wednesday 7:30 AM to 4:00 PM ____________________________________________________________________________________________   Epidural Steroid Injection  An epidural steroid injection is a shot of steroid medicine and numbing medicine that is given into the space between the spinal cord and the bones of the back (epidural space). The shot helps relieve pain caused by an irritated or swollen nerve root. The amount of pain relief you get from the injection depends on what is causing the nerve to be swollen and irritated, and how long your pain lasts. You are more likely to benefit from this injection if your pain is strong and comes on suddenly rather than if you have had long-term (chronic) pain. Tell a health care provider about:  Any allergies you have.  All medicines you are taking, including vitamins, herbs, eye drops, creams, and over-the-counter medicines.  Any problems you or family members have had with anesthetic medicines.  Any blood disorders you have.  Any surgeries you have had.  Any medical conditions you have.  Whether you are pregnant or may be pregnant. What are the risks? Generally, this is a safe procedure. However, problems may occur, including:  Headache.  Bleeding.  Infection.  Allergic reaction to medicines.  Nerve damage. What happens before the procedure? Staying hydrated Follow instructions from your health care provider about hydration, which may include:  Up to 2 hours before the procedure - you may continue to drink clear liquids, such as water, clear fruit juice, black coffee, and plain tea. Eating and drinking restrictions Follow instructions from your health care provider about eating and drinking, which may include:  8 hours before the procedure - stop eating heavy meals or foods, such as meat, fried  foods, or fatty foods.  6 hours before the procedure - stop eating light meals or foods, such as toast or cereal.  6 hours before the procedure - stop drinking milk or drinks that contain milk.  2 hours before the procedure - stop drinking clear liquids. Medicines  You may be given medicines to lower anxiety.  Ask your health care provider about: ? Changing or stopping your regular medicines. This is especially important if you are taking diabetes medicines or blood thinners. ? Taking medicines such as aspirin and ibuprofen. These medicines can thin your blood. Do not take these medicines unless your health care provider tells you to take them. ? Taking over-the-counter medicines, vitamins, herbs, and supplements.  Ask your health care provider what steps will be taken to prevent infection. General instructions  Plan to have someone take you home from the hospital or clinic.  If you will be going home right after the procedure, plan to have someone with you for 24 hours. What happens during the procedure?  An IV will be inserted into one of your veins.  You will be given one or more of the following: ? A medicine to help you relax (sedative). ? A medicine to numb the area (local anesthetic).  You will be asked to lie on your abdomen or sit.  The injection site will be cleaned.  A needle will be inserted through your skin into the epidural space. This may cause you some discomfort. An X-ray machine will be used to guide the needle as close as possible to the affected nerve.  A steroid medicine and a   local anesthetic will be injected into the epidural space.  The needle and IV will be removed.  A bandage (dressing) will be put over the injection site. The procedure may vary among health care providers and hospitals. What can I expect after the procedure? Follow these instructions at home: Injection site care  You may remove the bandage (dressing) after 24 hours.  Check  your injection site every day for signs of infection. Check for: ? Redness, swelling, or pain. ? Fluid or blood. ? Warmth. ? Pus or a bad smell. Managing pain, stiffness, and swelling  For 24 hours after the procedure: ? Avoid using heat on the injection site. ? Do not take baths, swim, or use a hot tub until your health care provider approves. Ask your health care provider if you may take a shower. You may only be allowed to take sponge baths.  If directed, put ice on the injection site. To do this: ? Put ice in a plastic bag. ? Place a towel between your skin and the bag. ? Leave the ice on for 20 minutes, 2-3 times a day.  Activity  Do not drive for 24 hours if you were given a sedative during your procedure.  Return to your normal activities as told by your health care provider. Ask your health care provider what activities are safe for you. General instructions  Your blood pressure, heart rate, breathing rate, and blood oxygen level will be monitored until you leave the hospital or clinic.  Your arm or leg may feel weak or numb for a few hours.  The injection site may feel sore.  Take over-the-counter and prescription medicines only as told by your health care provider.  Drink enough fluid to keep your urine pale yellow.  Keep all follow-up visits as told by your health care provider. This is important. Contact a health care provider if:  You have any of these signs of infection: ? Redness, swelling, or pain around your injection site. ? Fluid or blood coming from your injection site. ? Warmth coming from your injection site. ? Pus or a bad smell coming from your injection site. ? A fever.  You continue to have pain and soreness around the injection site, even after taking over-the-counter pain medicine.  You have severe, sudden, or lasting nausea or vomiting. Get help right away if:  You have severe pain at the injection site that is not relieved by  medicines.  You develop a severe headache or a stiff neck.  You become sensitive to light.  You have any new numbness or weakness in your legs or arms.  You lose control of your bladder or bowel movements.  You have trouble breathing. Summary  An epidural steroid injection is a shot of steroid medicine and numbing medicine that is given into the epidural space.  The shot helps relieve pain caused by an irritated or swollen nerve root.  You are more likely to benefit from this injection if your pain is strong and comes on suddenly rather than if you have had chronic pain. This information is not intended to replace advice given to you by your health care provider. Make sure you discuss any questions you have with your health care provider. Document Revised: 10/17/2018 Document Reviewed: 10/17/2018 [employer] Patient Education  2020 [employer]  

## 2020-02-08 ENCOUNTER — Telehealth: Payer: Self-pay

## 2020-02-08 NOTE — Telephone Encounter (Signed)
I called the patient to schedule his procedure, I couldn't get him in until 11/8 and he wants to make sure Dr. Cherylann Ratel will extend his out of work note until then. He said he could pick it up when he comes in for the procedure but he needs confirmation that it will happen.

## 2020-02-13 ENCOUNTER — Telehealth: Payer: Self-pay | Admitting: Student in an Organized Health Care Education/Training Program

## 2020-02-13 NOTE — Telephone Encounter (Signed)
Called patient to let him know that we do write for people to be out of work and that he will need to check with his primary or the person that referred him for the chronic pain. Patient verbalizes u/o information.

## 2020-02-13 NOTE — Telephone Encounter (Signed)
Patient wants to know if you will write him out for extended leave of absence until his pain is better. He is scheduled for CESI on 02-26-20. Please call patient for any further details

## 2020-02-26 ENCOUNTER — Other Ambulatory Visit: Payer: Self-pay

## 2020-02-26 ENCOUNTER — Ambulatory Visit
Admission: RE | Admit: 2020-02-26 | Discharge: 2020-02-26 | Disposition: A | Discharge status: Home / Self Care | Payer: Commercial Managed Care - PPO | Source: Ambulatory Visit | Attending: Student in an Organized Health Care Education/Training Program | Admitting: Student in an Organized Health Care Education/Training Program

## 2020-02-26 ENCOUNTER — Ambulatory Visit (HOSPITAL_BASED_OUTPATIENT_CLINIC_OR_DEPARTMENT_OTHER): Payer: Commercial Managed Care - PPO | Admitting: Student in an Organized Health Care Education/Training Program

## 2020-02-26 ENCOUNTER — Encounter: Payer: Self-pay | Admitting: Student in an Organized Health Care Education/Training Program

## 2020-02-26 DIAGNOSIS — M5412 Radiculopathy, cervical region: Secondary | ICD-10-CM | POA: Diagnosis not present

## 2020-02-26 DIAGNOSIS — G894 Chronic pain syndrome: Secondary | ICD-10-CM | POA: Insufficient documentation

## 2020-02-26 MED ORDER — SODIUM CHLORIDE (PF) 0.9 % IJ SOLN
INTRAMUSCULAR | Status: AC
  Filled 2020-02-26: qty 10

## 2020-02-26 MED ORDER — DEXAMETHASONE SODIUM PHOSPHATE 10 MG/ML IJ SOLN
10.0000 mg | Freq: Once | INTRAMUSCULAR | Status: AC
  Administered 2020-02-26: 10 mg

## 2020-02-26 MED ORDER — ROPIVACAINE HCL 2 MG/ML IJ SOLN
INTRAMUSCULAR | Status: AC
  Filled 2020-02-26: qty 10

## 2020-02-26 MED ORDER — DIAZEPAM 5 MG PO TABS
ORAL_TABLET | ORAL | Status: AC
  Filled 2020-02-26: qty 1

## 2020-02-26 MED ORDER — DEXAMETHASONE SODIUM PHOSPHATE 10 MG/ML IJ SOLN
INTRAMUSCULAR | Status: AC
  Filled 2020-02-26: qty 1

## 2020-02-26 MED ORDER — ROPIVACAINE HCL 2 MG/ML IJ SOLN
1.0000 mL | Freq: Once | INTRAMUSCULAR | Status: AC
  Administered 2020-02-26: 10 mL via EPIDURAL

## 2020-02-26 MED ORDER — SODIUM CHLORIDE 0.9% FLUSH
1.0000 mL | Freq: Once | INTRAVENOUS | Status: AC
  Administered 2020-02-26: 10 mL

## 2020-02-26 MED ORDER — DIAZEPAM 5 MG PO TABS
5.0000 mg | ORAL_TABLET | Freq: Once | ORAL | Status: AC
  Administered 2020-02-26: 5 mg via ORAL

## 2020-02-26 MED ORDER — LIDOCAINE HCL 2 % IJ SOLN
INTRAMUSCULAR | Status: AC
  Filled 2020-02-26: qty 20

## 2020-02-26 MED ORDER — IOHEXOL 180 MG/ML  SOLN
10.0000 mL | Freq: Once | INTRAMUSCULAR | Status: AC
  Administered 2020-02-26: 10 mL via EPIDURAL
  Filled 2020-02-26: qty 20

## 2020-02-26 MED ORDER — LIDOCAINE HCL 2 % IJ SOLN
20.0000 mL | Freq: Once | INTRAMUSCULAR | Status: AC
  Administered 2020-02-26: 400 mg

## 2020-02-26 NOTE — Patient Instructions (Signed)

## 2020-02-26 NOTE — Progress Notes (Signed)
PROVIDER NOTE: Information contained herein reflects review and annotations entered in association with encounter. Interpretation of such information and data should be left to medically-trained personnel. Information provided to patient can be located elsewhere in the medical record under "Patient Instructions". Document created using STT-dictation technology, any transcriptional errors that may result from process are unintentional.    Patient: Keith Rogers  Service Category: Procedure  Provider: Edward Jolly, MD  DOB: 04-May-1960  DOS: 02/26/2020  Location: [facility]  MRN: [id number]  Setting: Ambulatory - outpatient  Referring Provider: Edward Jolly, MD  Type: Established Patient  Specialty: Interventional Pain Management  PCP: Olena Leatherwood, FNP   Primary Reason for Visit: Interventional Pain Management Treatment. CC: Neck Pain (midline), Back Pain (lumbar midline), and Hand Pain (left CTS)  Procedure:          Anesthesia, Analgesia, Anxiolysis:  Type: Diagnostic, Inter-Laminar, Cervical Epidural Steroid Injection  #1  Region: Posterior Cervico-thoracic Region Level: C7-T1 Laterality: Midline Paramedial  Type: Local Anesthesia with PO Valium 5mg   Local Anesthetic: Lidocaine 1-2%  Position: Prone with head of the table was raised to facilitate breathing.   Indications: 1. Cervical radicular pain   2. Chronic pain syndrome    Pain Score: Pre-procedure: 7 /10 Post-procedure: 0-No pain/10   Pre-op Assessment:  Keith Rogers is a 59 y.o. (year old), male patient, seen today for interventional treatment. He  has a past surgical history that includes Appendectomy. Keith Rogers has a current medication list which includes the following prescription(s): albuterol, celecoxib, diclofenac, escitalopram, gabapentin, hydrochlorothiazide, ibuprofen, propranolol, aerochamber plus, tamsulosin, and tramadol-acetaminophen. His primarily concern today is the Neck Pain (midline), Back Pain  (lumbar midline), and Hand Pain (left CTS)  Initial Vital Signs:  Pulse/HCG Rate: 83ECG Heart Rate: 90 Temp: 97.9 F (36.6 C) Resp: 16 BP: 120/71 SpO2: 94 %  BMI: Estimated body mass index is 39.98 kg/m as calculated from the following:   Height as of this encounter: 6\' 1"  (1.854 m).   Weight as of this encounter: 303 lb (137.4 kg).  Risk Assessment: Allergies: Reviewed. He has No Known Allergies.  Allergy Precautions: None required Coagulopathies: Reviewed. None identified.  Blood-thinner therapy: None at this time Active Infection(s): Reviewed. None identified. Keith Rogers is afebrile  Site Confirmation: Keith Rogers was asked to confirm the procedure and laterality before marking the site Procedure checklist: Completed Consent: Before the procedure and under the influence of no sedative(s), amnesic(s), or anxiolytics, the patient was informed of the treatment options, risks and possible complications. To fulfill our ethical and legal obligations, as recommended by the American Medical Association's Code of Ethics, I have informed the patient of my clinical impression; the nature and purpose of the treatment or procedure; the risks, benefits, and possible complications of the intervention; the alternatives, including doing nothing; the risk(s) and benefit(s) of the alternative treatment(s) or procedure(s); and the risk(s) and benefit(s) of doing nothing. The patient was provided information about the general risks and possible complications associated with the procedure. These may include, but are not limited to: failure to achieve desired goals, infection, bleeding, organ or nerve damage, allergic reactions, paralysis, and death. In addition, the patient was informed of those risks and complications associated to Spine-related procedures, such as failure to decrease pain; infection (i.e.: Meningitis, epidural or intraspinal abscess); bleeding (i.e.: epidural hematoma, subarachnoid hemorrhage,  or any other type of intraspinal or peri-dural bleeding); organ or nerve damage (i.e.: Any type of peripheral nerve, nerve root, or spinal cord injury)  with subsequent damage to sensory, motor, and/or autonomic systems, resulting in permanent pain, numbness, and/or weakness of one or several areas of the body; allergic reactions; (i.e.: anaphylactic reaction); and/or death. Furthermore, the patient was informed of those risks and complications associated with the medications. These include, but are not limited to: allergic reactions (i.e.: anaphylactic or anaphylactoid reaction(s)); adrenal axis suppression; blood sugar elevation that in diabetics may result in ketoacidosis or comma; water retention that in patients with history of congestive heart failure may result in shortness of breath, pulmonary edema, and decompensation with resultant heart failure; weight gain; swelling or edema; medication-induced neural toxicity; particulate matter embolism and blood vessel occlusion with resultant organ, and/or nervous system infarction; and/or aseptic necrosis of one or more joints. Finally, the patient was informed that Medicine is not an exact science; therefore, there is also the possibility of unforeseen or unpredictable risks and/or possible complications that may result in a catastrophic outcome. The patient indicated having understood very clearly. We have given the patient no guarantees and we have made no promises. Enough time was given to the patient to ask questions, all of which were answered to the patient's satisfaction. Keith Rogers has indicated that he wanted to continue with the procedure. Attestation: I, the ordering provider, attest that I have discussed with the patient the benefits, risks, side-effects, alternatives, likelihood of achieving goals, and potential problems during recovery for the procedure that I have provided informed consent. Date  Time: 02/26/2020 11:09 AM  Pre-Procedure  Preparation:  Monitoring: As per clinic protocol. Respiration, ETCO2, SpO2, BP, heart rate and rhythm monitor placed and checked for adequate function Safety Precautions: Patient was assessed for positional comfort and pressure points before starting the procedure. Time-out: I initiated and conducted the "Time-out" before starting the procedure, as per protocol. The patient was asked to participate by confirming the accuracy of the "Time Out" information. Verification of the correct person, site, and procedure were performed and confirmed by me, the nursing staff, and the patient. "Time-out" conducted as per Joint Commission's Universal Protocol (UP.01.01.01). Time: 1150  Description of Procedure:          Target Area: For Epidural Steroid injections the target is the interlaminar space, initially targeting the lower border of the superior vertebral body lamina. Approach: Paramedial approach. Area Prepped: Entire PosteriorCervical Region DuraPrep (Iodine Povacrylex [0.7% available iodine] and Isopropyl Alcohol, 74% w/w) Safety Precautions: Aspiration looking for blood return was conducted prior to all injections. At no point did we inject any substances, as a needle was being advanced. No attempts were made at seeking any paresthesias. Safe injection practices and needle disposal techniques used. Medications properly checked for expiration dates. SDV (single dose vial) medications used. Description of the Procedure: Protocol guidelines were followed. The procedure needle was introduced through the skin, ipsilateral to the reported pain, and advanced to the target area. Bone was contacted and the needle walked caudad, until the lamina was cleared. The epidural space was identified using "loss-of-resistance technique" with 2-3 ml of PF-NaCl (0.9% NSS), in a 5cc LOR glass syringe. Vitals:   02/26/20 1123 02/26/20 1153 02/26/20 1157  BP: 120/71 119/79 127/66  Pulse: 83    Resp: 16 19 20   Temp: 97.9 F  (36.6 C)    TempSrc: Temporal    SpO2: 94% 97% 97%  Weight: (!) 303 lb (137.4 kg)    Height: 6\' 1"  (1.854 m)      Start Time: 1151 hrs. End Time: 1156 hrs. Materials:  Needle(s)  Type: Epidural needle Gauge: 22G Length: 3.5-in Medication(s): Please see orders for medications and dosing details. 4 cc solution made of 2 cc of preservative-free saline, 1 cc of 0.2% ropivacaine, 1 cc of Decadron 10 mg/cc.  Imaging Guidance (Spinal):          Type of Imaging Technique: Fluoroscopy Guidance (Spinal) Indication(s): Assistance in needle guidance and placement for procedures requiring needle placement in or near specific anatomical locations not easily accessible without such assistance. Exposure Time: Please see nurses notes. Contrast: Before injecting any contrast, we confirmed that the patient did not have an allergy to iodine, shellfish, or radiological contrast. Once satisfactory needle placement was completed at the desired level, radiological contrast was injected. Contrast injected under live fluoroscopy. No contrast complications. See chart for type and volume of contrast used. Fluoroscopic Guidance: I was personally present during the use of fluoroscopy. "Tunnel Vision Technique" used to obtain the best possible view of the target area. Parallax error corrected before commencing the procedure. "Direction-depth-direction" technique used to introduce the needle under continuous pulsed fluoroscopy. Once target was reached, antero-posterior, oblique, and lateral fluoroscopic projection used confirm needle placement in all planes. Images permanently stored in EMR. Interpretation: I personally interpreted the imaging intraoperatively. Adequate needle placement confirmed in multiple planes. Appropriate spread of contrast into desired area was observed. No evidence of afferent or efferent intravascular uptake. No intrathecal or subarachnoid spread observed. Permanent images saved into the patient's  record.  Antibiotic Prophylaxis:   Anti-infectives (From admission, onward)   None     Indication(s): None identified  Post-operative Assessment:  Post-procedure Vital Signs:  Pulse/HCG Rate: 8390 Temp: 97.9 F (36.6 C) Resp: 20 BP: 127/66 SpO2: 97 %  EBL: None  Complications: No immediate post-treatment complications observed by team, or reported by patient.  Note: The patient tolerated the entire procedure well. A repeat set of vitals were taken after the procedure and the patient was kept under observation following institutional policy, for this type of procedure. Post-procedural neurological assessment was performed, showing return to baseline, prior to discharge. The patient was provided with post-procedure discharge instructions, including a section on how to identify potential problems. Should any problems arise concerning this procedure, the patient was given instructions to immediately contact us, at any time, without hesitation. In any case, we plan to contact the patient by telephone for a follow-up status report regarding this interventional procedure.  Comments:  No additional relevant information.  5 out of 5 strength bilateral upper extremity: Shoulder abduction, elbow flexion, elbow extension, thumb extension.   Plan of Care  Orders:  Orders Placed This Encounter  Procedures  . DG PAIN CLINIC C-ARM 1-60 MIN NO REPORT    Intraoperative interpretation by procedural physician at [facility].    Standing Status:   Standing    Number of Occurrences:   1    Order Specific Question:   Reason for exam:    Answer:   Assistance in needle guidance and placement for procedures requiring needle placement in or near specific anatomical locations not easily accessible without such assistance.   Medications ordered for procedure: Meds ordered this encounter  Medications  . iohexol (OMNIPAQUE) 180 MG/ML injection 10 mL    Must be Myelogram-compatible. If not  available, you may substitute with a water-soluble, non-ionic, hypoallergenic, myelogram-compatible radiological contrast medium.  Marland Kitchen lidocaine (XYLOCAINE) 2 % (with pres) injection 400 mg  . ropivacaine (PF) 2 mg/mL (0.2%) (NAROPIN) injection 1 mL  . sodium chloride flush (NS) 0.9 %  injection 1 mL  . dexamethasone (DECADRON) injection 10 mg  . diazepam (VALIUM) tablet 5 mg   Medications administered: We administered iohexol, lidocaine, ropivacaine (PF) 2 mg/mL (0.2%), sodium chloride flush, dexamethasone, and diazepam.  See the medical record for exact dosing, route, and time of administration.  Follow-up plan:   Return in about 5 weeks (around 04/01/2020) for Post Procedure Evaluation, virtual.         Recent Visits Date Type Provider Dept  02/06/20 Office Visit Edward JollyLateef, Shawntez Dickison, MD [facility]  Showing recent visits within past 90 days and meeting all other requirements Today's Visits Date Type Provider Dept  02/26/20 Procedure visit Edward JollyLateef, Kadir Azucena, MD [facility]  Showing today's visits and meeting all other requirements Future Appointments No visits were found meeting these conditions. Showing future appointments within next 90 days and meeting all other requirements  Disposition: Discharge home  Discharge (Date  Time): 02/26/2020; 1215 hrs.   Primary Care Physician: Olena LeatherwoodFarrug, Eugene D, FNP [facility] Note by: Edward JollyBilal Kynsleigh Westendorf, MD Date: 02/26/2020; Time: 12:03 PM  Disclaimer:  Medicine is not an exact science. The only guarantee in medicine is that nothing is guaranteed. It is important to note that the decision to proceed with this intervention was based on the information collected from the patient. The Data and conclusions were drawn from the patient's questionnaire, the interview, and the physical examination. Because the information was provided in large part by the patient, it cannot be guaranteed that it has not been  purposely or unconsciously manipulated. Every effort has been made to obtain as much relevant data as possible for this evaluation. It is important to note that the conclusions that lead to this procedure are derived in large part from the available data. Always take into account that the treatment will also be dependent on availability of resources and existing treatment guidelines, considered by other Pain Management Practitioners as being common knowledge and practice, at the time of the intervention. For Medico-Legal purposes, it is also important to point out that variation in procedural techniques and pharmacological choices are the acceptable norm. The indications, contraindications, technique, and results of the above procedure should only be interpreted and judged by a Board-Certified Interventional Pain Specialist with extensive familiarity and expertise in the same exact procedure and technique.

## 2020-02-27 ENCOUNTER — Telehealth: Payer: Self-pay

## 2020-02-27 NOTE — Telephone Encounter (Signed)
Called PP and states she is doing well. Instructed to call if needed. Patient with understanding.

## 2020-03-29 ENCOUNTER — Encounter: Payer: Self-pay | Admitting: Student in an Organized Health Care Education/Training Program

## 2020-04-01 ENCOUNTER — Encounter: Payer: Self-pay | Admitting: Student in an Organized Health Care Education/Training Program

## 2020-04-01 ENCOUNTER — Ambulatory Visit
Payer: Commercial Managed Care - PPO | Attending: Student in an Organized Health Care Education/Training Program | Admitting: Student in an Organized Health Care Education/Training Program

## 2020-04-01 ENCOUNTER — Other Ambulatory Visit: Payer: Self-pay

## 2020-04-01 DIAGNOSIS — M5136 Other intervertebral disc degeneration, lumbar region: Secondary | ICD-10-CM | POA: Diagnosis not present

## 2020-04-01 DIAGNOSIS — M5412 Radiculopathy, cervical region: Secondary | ICD-10-CM

## 2020-04-01 DIAGNOSIS — M47816 Spondylosis without myelopathy or radiculopathy, lumbar region: Secondary | ICD-10-CM | POA: Diagnosis not present

## 2020-04-01 DIAGNOSIS — G894 Chronic pain syndrome: Secondary | ICD-10-CM

## 2020-04-01 NOTE — Progress Notes (Signed)
Patient: Keith Rogers  Service Category: E/M  Provider: Gillis Santa, MD  DOB: [birth year]  DOS: 04/01/2020  Location: Office  MRN: [id number]  Setting: Ambulatory outpatient  Referring Provider: Romualdo Bolk, FNP  Type: Established Patient  Specialty: Interventional Pain Management  PCP: Keith Bolk, FNP  Location: Home  Delivery: TeleHealth     Virtual Encounter - Pain Management PROVIDER NOTE: Information contained herein reflects review and annotations entered in association with encounter. Interpretation of such information and data should be left to medically-trained personnel. Information provided to patient can be located elsewhere in the medical record under "Patient Instructions". Document created using STT-dictation technology, any transcriptional errors that may result from process are unintentional.    Contact & Pharmacy Preferred: [phone] Home: [phone] (home) Mobile: [phone] (mobile) E-mail: [email or web address]  [place], [place] [facility] [place] [address] [place] [address] Phone: [phone] Fax: [phone]   Pre-screening  Keith Rogers offered "in-person" vs "virtual" encounter. He indicated preferring virtual for this encounter.   Reason COVID-19*  Social distancing based on CDC and AMA recommendations.   I contacted Keith Rogers on 04/01/2020 via video conference.      I clearly identified myself as Gillis Santa, MD. I verified that I was speaking with the correct person using two identifiers (Name: Keith Rogers, and date of birth: 1961/03/20).  Consent I sought verbal advanced consent from Keith Rogers for virtual visit interactions. I informed Keith Rogers of possible security and privacy concerns, risks, and limitations associated with providing "not-in-person" medical evaluation and management services. I also informed Keith Rogers of the availability of "in-person" appointments. Finally, I informed him that there would  be a charge for the virtual visit and that he could be  personally, fully or partially, financially responsible for it. Keith Rogers expressed understanding and agreed to proceed.   Historic Elements   Keith Rogers is a 59 y.o. year old, male patient evaluated today after our last contact on 02/26/2020. Keith Rogers  has a past medical history of Arthritis, Asthma, Depression, and Hypertension. He also  has a past surgical history that includes Appendectomy. Keith Rogers has a current medication list which includes the following prescription(s): albuterol, celecoxib, diclofenac, escitalopram, gabapentin, hydrochlorothiazide, ibuprofen, propranolol, aerochamber plus, tamsulosin, and tramadol-acetaminophen. He  reports that he quit smoking about 6 months ago. His smoking use included cigarettes. He smoked 1.00 pack per day. He has never used smokeless tobacco. He reports current alcohol use. He reports that he does not use drugs. Keith Rogers.   HPI  Today, he is being contacted for a post-procedure assessment.   Post-Procedure Evaluation  Procedure (02/26/2020):   Type: Diagnostic, Inter-Laminar, Cervical Epidural Steroid Injection  #1  Region: Posterior Cervico-thoracic Region Level: C7-T1 Laterality: Midline Paramedial  Sedation: Please see nurses note.  Effectiveness during initial hour after procedure(Ultra-Short Term Relief): 100 %   Local anesthetic used: Long-acting (4-6 hours) Effectiveness: Defined as any analgesic benefit obtained secondary to the administration of local anesthetics. This carries significant diagnostic value as to the etiological location, or anatomical origin, of the pain. Duration of benefit is expected to coincide with the duration of the local anesthetic used.  Effectiveness during initial 4-6 hours after procedure(Short-Term Relief): 100 %   Long-term benefit: Defined as any relief past the pharmacologic duration of the local anesthetics.   Effectiveness past the initial 6 hours after procedure(Long-Term Relief): 85 %   Current benefits: Defined as benefit  that persist at this time.   Analgesia:  90-100% better Function: Keith Rogers reports improvement in function ROM: Keith Rogers reports improvement in ROM   Laboratory Chemistry Profile   Renal Lab Results  Component Value Date   BUN 17 10/27/2017   CREATININE 1.09 10/27/2017   GFRAA >60 10/27/2017   GFRNONAA >60 10/27/2017     Hepatic Lab Results  Component Value Date   AST 27 10/27/2017   ALT 22 10/27/2017   ALBUMIN 4.5 10/27/2017   ALKPHOS 63 10/27/2017     Electrolytes Lab Results  Component Value Date   NA 140 10/27/2017   K 4.4 10/27/2017   CL 102 10/27/2017   CALCIUM 10.0 10/27/2017     Bone No results found for: VD25OH, VD125OH2TOT, [id number], TI1443XV4, 25OHVITD1, 25OHVITD2, 25OHVITD3, TESTOFREE, TESTOSTERONE   Inflammation (CRP: Acute Phase) (ESR: Chronic Phase) No results found for: CRP, ESRSEDRATE, LATICACIDVEN     Note: Above Lab results reviewed.   XR Lumbar Spine 2 or 3 Views   Impression Performed by [facility] RAD -- Severe multilevel degenerative disc disease, most prominent L4-S1.  -- Multilevel facet arthropathy, most prominent at L4-S1.  Narrative Performed by [facility] RAD EXAM: XR LUMBAR SPINE 2 OR 3 VIEWS  DATE: 03/14/2018 12:43 PM  ACCESSION: [id number]  DICTATED: 03/14/2018 1:35 PM  INTERPRETATION LOCATION: [place]   CLINICAL INDICATION: 59 years old Male with low back pain with radicular symptoms - M54.42 - Acute left - sided low back pain with left - sided sciatica    COMPARISON: None available.   TECHNIQUE: AP and lateral views of the lumbar spine.   FINDINGS:  The L4-L5 intervertebral disc spaces the proximal level of the iliac crests.   No acute fracture identified. Likely degenerative retrolisthesis of L2 on L3 and L3 on L4. Multilevel moderate to severe intervertebral disc space narrowing, most prominent at  L4-L5 and L5-S1. Prominent multilevel anterior endplate osteophytosis and endplate sclerosis. Multilevel facet arthropathy, most prominent at L4-S1.     Assessment  The primary encounter diagnosis was Cervical radicular pain. Diagnoses of Lumbar facet arthropathy, Lumbar degenerative disc disease, and Chronic pain syndrome were also pertinent to this visit.  Plan of Care   1. Cervical radicular pain -Status post cervical epidural steroid injection on 02/26/2020.  Significant pain relief and improvement in functional status.  Repeat as needed. - Cervical Epidural Injection; Standing  2. Low back pain secondary to Lumbar facet arthropathy Ottavio Norem has a history of greater than 3 months of moderate to severe pain which is resulted in functional impairment.  The patient has tried various conservative therapeutic options such as NSAIDs, Tylenol, muscle relaxants, physical therapy which was inadequately effective.  Patient's pain is predominantly axial with physical exam findings (+pain with facet loading and lumbar extension) and xray findings above suggestive of facet arthropathy.  Lumbar facet medial branch nerve blocks were discussed with the patient.  Risks and benefits were reviewed.  Patient would like to proceed with bilateral L3, L4, L5 medial branch nerve block.  - LUMBAR FACET(MEDIAL BRANCH NERVE BLOCK) MBNB; Future  3. Chronic pain syndrome - Cervical Epidural Injection; Standing - LUMBAR FACET(MEDIAL BRANCH NERVE BLOCK) MBNB; Future   Orders:  Orders Placed This Encounter  Procedures  . Cervical Epidural Injection    Procedure: Cervical Epidural Steroid Injection/Block Purpose: Diagnostic Indication(s): Radiculitis and/or cervicalgia associater with cervical degenerative disc disease.    Standing Status:   Standing    Number of Occurrences:   6  Standing Expiration Date:   04/01/2021    Scheduling Instructions:     Level(s): C7-T1     Laterality: TBD     Sedation:  Patient's choice.     Timeframe: PRN    Order Specific Question:   Where will this procedure be performed?    Answer:   [facility]    Comments:   Saud Bail  . LUMBAR FACET(MEDIAL BRANCH NERVE BLOCK) MBNB    Standing Status:   Future    Standing Expiration Date:   05/02/2020    Scheduling Instructions:     Procedure: Lumbar facet block (AKA.: Lumbosacral medial branch nerve block)     Side: Bilateral     Level: L3-4, L4-5, Facets (L3, L4, L5,Medial Branch Nerves)     Sedation: P.o. Valium     Timeframe: ASAA    Order Specific Question:   Where will this procedure be performed?    Answer:   [facility]   Follow-up plan:   Return in about 4 weeks (around 04/29/2020) for B/L L3,4,5 Fcts with PO Valium.     Status post midline C7-T1 ESI on 02/26/2020: 90 to 100% pain relief and improvement in functional status.  Now having increased low back pain related to lumbar facet arthropathy and spondylosis.  Plan for diagnostic lumbar facet medial branch nerve blocks bilaterally at L3, L4, L5.   Recent Visits Date Type Provider Dept  02/26/20 Procedure visit Gillis Santa, MD [facility]  02/06/20 Office Visit Gillis Santa, MD [facility]  Showing recent visits within past 90 days and meeting all other requirements Today's Visits Date Type Provider Dept  04/01/20 Telemedicine Gillis Santa, MD [facility]  Showing today's visits and meeting all other requirements Future Appointments No visits were found meeting these conditions. Showing future appointments within next 90 days and meeting all other requirements  I discussed the assessment and treatment plan with the patient. The patient was provided an opportunity to ask questions and all were answered. The patient agreed with the plan and demonstrated an understanding of the instructions.  Patient advised to call back or seek an in-person evaluation if the symptoms or condition worsens.  Duration of  encounter: 87mnutes.  Note by: BGillis Santa MD Date: 04/01/2020; Time: 2:01 PM

## 2020-04-16 NOTE — H&P (Signed)
ORTHOPAEDIC HISTORY & PHYSICAL Progress Notes Jotham Ahn, Adelina Mings., MD - 04/05/2020 10:30 AM EST Chief Complaint: Chief Complaint  Patient presents with  . Carpal Tunnel  Right carpal tunnel syndrome   Reason for Visit: The patient is a 59 y.o. left hand dominant male who presents today for reevaluation of his hands. He reports a relatively long history of numbness and paresthesias to the right hand. He has also noticed progressive decrease in his grip strength. He has not appreciated any significant improvement despite splinting, activity modification, and NSAIDs. He denies any symptoms to the left hand.  Of note, the patient is being treated by Dr. Cherylann Ratel for cervical radiculitis. He has received epidural steroid injections.  Medications: Current Outpatient Medications  Medication Sig Dispense Refill  . celecoxib (CELEBREX) 100 MG capsule Take 1 capsule (100 mg total) by mouth 2 (two) times daily 60 capsule 1  . diclofenac (VOLTAREN) 50 MG EC tablet Take 50 mg by mouth 2 (two) times daily  . escitalopram oxalate (LEXAPRO) 10 MG tablet Take 10 mg by mouth once daily  . gabapentin (NEURONTIN) 300 MG capsule 1 tablet prn for pain. May take every 8 hours if pain persists.  . hydroCHLOROthiazide (HYDRODIURIL) 12.5 MG tablet Take 12.5 mg by mouth once daily  . propranolol (INDERAL) 10 MG tablet Take 10 mg by mouth 2 (two) times daily  . tamsulosin (FLOMAX) 0.4 mg capsule Take 0.4 mg by mouth once daily  . traMADol-acetaminophen (ULTRACET) 37.5-325 mg tablet TAKE 1 TO 2 TABLETS BY MOUTH EVERY 6 HOURS AS NEEDED FOR PAIN  . tiZANidine (ZANAFLEX) 4 MG tablet Take 1 tablet (4 mg total) by mouth 3 (three) times daily as needed for Muscle spasms (Patient not taking: Reported on 04/05/2020 ) 60 tablet 1   No current facility-administered medications for this visit.   Allergies: No Known Allergies  Past Medical History: Past Medical History:  Diagnosis Date  . Arthritis  bilateral knees   . Diabetes mellitus type 2, uncomplicated (CMS-HCC)  history of, resolved with weight loss  . GERD (gastroesophageal reflux disease)  . Hyperlipidemia  resolved with diet and weight loss  . Hypertension  resolved with weight loss   Past Surgical History: Past Surgical History:  Procedure Laterality Date  . APPENDECTOMY   Social History: Social History   Socioeconomic History  . Marital status: Single  Spouse name: Not on file  . Number of children: 0  . Years of education: 46  . Highest education level: High school graduate  Occupational History  . Occupation: Full-time - Systems analyst  Tobacco Use  . Smoking status: Former Smoker  Packs/day: 2.00  Years: 40.00  Pack years: 80.00  Types: Cigarettes  Quit date: 09/18/2019  Years since quitting: 0.5  . Smokeless tobacco: Never Used  Vaping Use  . Vaping Use: Never used  Substance and Sexual Activity  . Alcohol use: Yes  Comment: occasional  . Drug use: No  . Sexual activity: Yes  Partners: Female  Comment: partner infertility  Other Topics Concern  . Not on file  Social History Narrative  . Not on file   Social Determinants of Health   Financial Resource Strain: Not on file  Food Insecurity: Not on file  Transportation Needs: Not on file  Physical Activity: Not on file  Stress: Not on file  Social Connections: Not on file  Housing Stability: Not on file   Family History: Family History  Problem Relation Age of Onset  . High blood  pressure (Hypertension) Father  . Coronary Artery Disease (Blocked arteries around heart) Father  . Cancer Father  secondary to agent orange exposure   Review of Systems: A comprehensive 14 point ROS was performed, reviewed, and the pertinent orthopaedic findings are documented in the HPI.  Exam BP 118/84  Temp 36.5 C (97.7 F)  Ht 177.8 cm (5\' 10" )  Wt (!) 142.9 kg (315 lb)  BMI 45.20 kg/m   General:  Well-developed, well-nourished male seen in no acute  distress.   HEENT:  Atraumatic, normocephalic. Pupils are equal and reactive to light. Extraocular motion is intact. Sclera are clear. Oropharynx is clear with moist mucosa.  Neck: Some guarding with attempted range of motion. Mild tenderness to palpation. Spurling`s test is negative.  Lungs:  Clear to auscultation bilaterally.  Cardiovascular:  Regular rate and rhythm. Normal S1, S2. No murmur . No appreciable gallops or rubs. Peripheral pulses are palpable.   Extremities:  Normal shoulder contour.  Good range of motion and stability of the shoulders, elbows, and wrists. Tinel`s test at the elbow is negative.  Right hand:  Tenderness: Negative Erythema: negative Swelling: negative Capillary Refill: normal Thenar atrophy: positive Intrinsic wasting: negative Grip strength: poor to fair grip strength Pincer strength: poor to fair pincer strength Tinel`s test: positive Phalen`s test: positive Triggering: No gross triggering or locking of the digits Finkelstein`s test: negative Range of motion: Good range of motion of the digits  Left hand:  Tenderness: Negative Erythema: Negative Swelling: Negative Capillary Refill: Normal Thenar atrophy: negative Intrinsic wasting: negative Grip strength: good grip strength Pincer strength: good pincer strength Tinel`s test: equivocal Phalen`s test: equivocal Triggering: No gross triggering or locking of the digits Finkelstein`s test: Negative Range of motion: Good range of motion of the digits  Neurologic:  Awake, alert , and oriented.  Sensory function is intact except for decreased discrimination to pinprick and light touch in a median nerve distribution. Motor strength is judged to be 5/5 except as noted above. No clonus or tremor.  Motor coordination is within normal limits.  MRI of the cervical spine: I reviewed the cervical MRI performed at [facility] on 02/05/2020. I concur with the radiologist's  interpretation as below:  MRI CERVICAL SPINE WITHOUT CONTRAST   TECHNIQUE:  Multiplanar, multisequence MR imaging of the cervical spine was  performed. No intravenous contrast was administered.   COMPARISON: 12/01/2019 cervical spine radiographs report.   FINDINGS:  Alignment: Reversal of cervical lordosis. Grade 1 C2-3, C3-4  anterolisthesis.   Vertebrae: C4-5 Modic type 2 endplate degenerative changes with  prominent anterior osteophytosis. Partial fusion of the C5-6  vertebral bodies. Vertebral body heights are preserved.   Cord: Normal signal and morphology.   Posterior Fossa, vertebral arteries: Negative.   Disc levels: Multilevel desiccation and disc space loss most  prominent at the C4-6 levels.   C2-3: Disc osteophyte complex. Prominent right subarticular  osteophyte abutting the ventral cord. Uncovertebral and bilateral  facet degenerative spurring. Mild spinal canal, moderate right and  mild left neural foraminal narrowing.   C3-4: Disc osteophyte complex with superimposed central protrusion,  uncovertebral and facet hypertrophy. Mild spinal canal, mild right  and moderate to severe left neural foraminal narrowing.   C4-5: Disc osteophyte complex with uncovertebral andfacet  hypertrophy. Mild spinal canal and bilateral neural foraminal  narrowing.   C5-6: Bilateral uncovertebral and facet degenerative spurring.  Patent spinal canal. Mild bilateral neural foraminal narrowing.   C6-7: Small disc osteophyte complex with small central protrusion  and uncovertebral hypertrophy. Patent spinal canal. Mild bilateral  neural foraminal narrowing.   C7-T1: Disc osteophyte complex with superimposed right foraminal  protrusion. Uncovertebral and facet hypertrophy. Partial effacement  of the ventral CSF containing spaces. Mild bilateral neural  foraminal narrowing.   Paraspinal tissues: Negative.   IMPRESSION:  Multilevel spondylosis with reversal of lordosis. Bulky  C4-5  anterior osteophytosis.   Moderate to severe right C2-3 and left C3-4 neural foraminal  narrowing.   Mild spinal canal narrowing at the C2-5 levels.   Mild left C2-3, right C3-4 and bilateral C4-T1 neural foraminal  narrowing.   Electronically Signed  By: Stana Bunting M.D.  On: 10/18/202113:36  Nerve conduction studies: EMG/nerve conduction studies performed by Dr. Cristopher Peru on 03/05/2020 were reviewed. There is electrical evidence of median sensorimotor peripheral neuropathy characteristic of carpal tunnel syndrome. Findings are consistent with severe (grade 4) degree of involvement on the right and moderate (grade 3) involvement on the left.   Impression: Bilateral carpal tunnel syndrome, right more symptomatic  Plan:  The findings were discussed in detail with the patient. The patient was given informational material on carpal tunnel release. Conservative treatment options were reviewed with the patient. We discussed the risks and benefits of surgical intervention. The usual perioperative course was also discussed in detail. I explained that the carpal tunnel release would not affect symptoms related to his cervical disease. The patient expressed understanding of the risks and benefits of surgical intervention and would like to proceed with plans for right carpal tunnel release.  MEDICAL CLEARANCE: Per anesthesiology ACTIVITIES: As tolerated. WORK STATUS: Anticipate out of work for 6-8 weeks due to the nature of his work duties THERAPY: None MEDICATIONS: Requested Prescriptions   No prescriptions requested or ordered in this encounter   FOLLOW-UP: Return for preop History & Physical pending surgery date.  Glenetta Kiger P. Angie Fava., M.D.   Electronically signed by Shari Heritage., MD at 04/07/2020

## 2020-04-16 NOTE — Discharge Instructions (Signed)
?  Instructions after Hand / Wrist Surgery ? ? Keith Rogers, Jr., M.D. ? Dept. of Orthopaedics & Sports Medicine ? [facility] ? [address] ? [place], [place]  [address] ? ? Phone: [phone]   Fax: [phone] ? ? ?DIET: ?Drink plenty of non-alcoholic fluids & begin a light diet. ?Resume your normal diet the day after surgery. ? ?ACTIVITY:  ?Keep the hand elevated above the level of the elbow. ?Begin gently moving the fingers on a regular basis to avoid stiffness. ?Avoid any heavy lifting, pushing, or pulling with the operative hand. ?Do not drive or operate any equipment until instructed. ? ?WOUND CARE:  ?Keep the splint/bandage clean and dry.  ?The splint and stitches will be removed in the office. ?Continue to use the ice packs periodically to reduce pain and swelling. ?You may bathe or shower after the stitches are removed at the first office visit following surgery. ? ?MEDICATIONS: ?You may resume your regular medications. ?Please take the pain medication as prescribed. ?Do not take pain medication on an empty stomach. ?Do not drive or drink alcoholic beverages when taking pain medications. ? ?CALL THE OFFICE FOR: ?Temperature above 101 degrees ?Excessive bleeding or drainage on the dressing. ?Excessive swelling, coldness, or paleness of the fingers. ?Persistent nausea and vomiting. ? ?FOLLOW-UP:  ?You should have an appointment to return to the office in 7-10 days after surgery.  ? ?REMEMBER: R.I.C.E. = Rest, Ice, Compression, Elevation !  ? ? ? ?[facility] ?Department Directory ?     ? ? ? ?[email or web address] ? ? ?  ? ? ?[email or web address] ?  ? ?      ?Cardiology ? ?Appointments: ?[place] - [phone] ?[place] - [phone]  Endocrinology ? ?Appointments: ?[place] - [phone] ?[place] - [phone]  Gastroenterology ? ?Appointments: ?[place] - [phone] ?[place] - [phone]  ?      ?General Surgery ? ? ?Appointments: ?[place] -  [phone]  Internal Medicine/Family Medicine ? ?Appointments: ?[place] - [phone] ?[place] - [phone] ?[place] - [phone]  Metabolic and Weigh Loss Surgery ? ?Appointments: ?[place] - [phone]  ?      ?Neurology ? ?Appointments: ?[place] - [phone] ?[place] - [phone]  Neurosurgery ? ?Appointments: ?[place] - [phone]  Obstetrics & Gynecology ? ?Appointments: ?[place] - [phone] ?[place] - [phone]  ?      ?Pediatrics ? ?Appointments: ?[place] - [phone] ?[place] - [phone]  Physiatry ? ?Appointments: ?[place] -[phone]  Physical Therapy ? ?Appointments: ?[place] - [phone] ?[place] - [phone]  ?      ?Podiatry ? ?Appointments: ?[place] - [phone] ?[place] - [phone]  Pulmonology ? ?Appointments: ?[place] - [phone]  Rheumatology ? ?Appointments: ?[place] - [phone]  ?      ?[place] Location: ?[facility]  ?[address] ?, [place]  [address]  [place] Location: ?[facility] ?[address] ?[place], [place]  [address]  [place] Location: ?[facility] ?[address] ?[place], [place]  [address]  ?  ?

## 2020-04-24 ENCOUNTER — Ambulatory Visit
Payer: Commercial Managed Care - PPO | Attending: Student in an Organized Health Care Education/Training Program | Admitting: Student in an Organized Health Care Education/Training Program

## 2020-04-25 ENCOUNTER — Inpatient Hospital Stay
Admission: RE | Admit: 2020-04-25 | Discharge: 2020-04-25 | Disposition: A | Discharge status: Home / Self Care | Payer: Commercial Managed Care - PPO | Source: Ambulatory Visit

## 2020-04-25 HISTORY — DX: Obesity, unspecified: E66.9

## 2020-04-25 HISTORY — DX: Radiculopathy, cervical region: M54.12

## 2020-04-26 ENCOUNTER — Encounter
Admission: RE | Admit: 2020-04-26 | Discharge: 2020-04-26 | Disposition: A | Discharge status: Home / Self Care | Payer: Commercial Managed Care - PPO | Source: Ambulatory Visit | Attending: Orthopedic Surgery | Admitting: Orthopedic Surgery

## 2020-04-26 ENCOUNTER — Encounter (HOSPITAL_COMMUNITY): Payer: Self-pay | Admitting: Urgent Care

## 2020-04-26 ENCOUNTER — Other Ambulatory Visit: Payer: Self-pay

## 2020-04-26 NOTE — Patient Instructions (Addendum)
INSTRUCTIONS FOR SURGERY     Your surgery is scheduled for: Wednesday, January 12TH       To find out your arrival time for the day of surgery,          please call [phone] between 1 pm and 3 pm on :  Tuesday, January 11TH     When you arrive for surgery, report to [facility]. Once registered, you will be     Sent to the second floor surgery desk.        REMEMBER: Instructions that are not followed completely may result in serious medical risk,  up to and including death, or upon the discretion of your surgeon and anesthesiologist,            your surgery may need to be rescheduled.  __X__ 1. Do not eat food after midnight the night before your procedure.                    No gum, candy, lozenger, tic tacs, tums or hard candies.                  ABSOLUTELY NOTHING SOLID IN YOUR MOUTH AFTER MIDNIGHT                    You may drink unlimited clear liquids up to 2 hours before you are scheduled to arrive for surgery.                   Do not drink anything within those 2 hours unless you need to take medicine, then take the                   smallest amount you need.  Clear liquids include:  water, apple juice without pulp,                   any flavor Gatorade, Black coffee, black tea.  Sugar may be added but no dairy/ honey /lemon.                        Broth and jello is not considered a clear liquid.  __x__  2. On the morning of surgery, please brush your teeth with toothpaste and water. You may rinse with                  mouthwash if you wish but DO NOT SWALLOW TOOTHPASTE OR MOUTHWASH  __X___3. NO alcohol for 24 hours before or after surgery.  __x___ 4.  Do NOT smoke or use e-cigarettes for 24 HOURS PRIOR TO SURGERY.                      DO NOT use any chewable tobacco products for at least 6 hours prior to surgery.  __x___ 5. If you start any new medication after this appointment and prior to surgery, please                    Bring it with you on the day of surgery.  ___x__ 6. Notify your doctor if there is any change  in your medical condition, such as fever,                   nfection, vomitting, diarrhea or any open sores.  __x___ 7.  USE the CHG SOAP as instructed, the night before surgery and the day of surgery.                   Once you have washed with this soap, do NOT use any of the following: Powders, perfumes                    or lotions. Please do not wear make up, hairpins, clips or nail polish. You may  wear deodorant.                   Men may shave their face and neck.  Women need to shave 48 hours prior to surgery.                   DO NOT wear ANY jewelry on the day of surgery. If there are rings that are too tight to                    remove easily, please address this prior to the surgery day. Piercings need to be removed.                                                                     NO METAL ON YOUR BODY.                    Do NOT bring any valuables.  If you came to Pre-Admit testing then you will not need license,                     insurance card or credit card.  If you will be staying overnight, please either leave your things in                     the car or have your family be responsible for these items.                     [facility] IS NOT RESPONSIBLE FOR BELONGINGS OR VALUABLES.  ___X__ 8. DO NOT wear contact lenses on surgery day.  You may not have dentures,                     Hearing aides, contacts or glasses in the operating room. These items can be                    Placed in the Recovery Room to receive immediately after surgery.  __x___ 9. IF YOU ARE SCHEDULED TO GO HOME ON THE SAME DAY, YOU MUST                   Have someone to drive you home and to stay with you  for the first 24 hours.                    Have an arrangement prior to arriving on surgery day.  ___x__ 10. Take the following  medications on the morning of surgery with a sip of  water:                              1. ALBUTEROL INHALER                     2. LEXAPRO                     3. INDERAL                     4. GABAPENTIN                     5. TAMSULOSIN                     6.  __X___ 11.  Follow any instructions provided to you by your surgeon.                        Such as enema, clear liquid bowel prep                     PLEASE COMPLETE THE PRESURGICAL CARBOHYDRATE DRINK                     BY TWO HOURS PRIOR TO ARRIVAL ON SURGERY DAY.  __X__  12. STOP ALL ASPIRIN PRODUCTS AS OF January 7TH.                       THIS INCLUDES BC POWDERS / GOODIES POWDER  __x___ 13. STOP Anti-inflammatories as of January 7TH.                      This includes IBUPROFEN / MOTRIN / ADVIL / ALEVE/ NAPROXYN / VOLTAREN                          AND CELEBREX                    YOU MAY TAKE TYLENOL ANY TIME PRIOR TO SURGERY.  ___X__ 14.  Stop supplements until after surgery.                     This includes: MULTIVITAMINS  __X____17.  Continue to take the following medications but do not take on the morning of surgery:                          HYDROCHLOROTHIAZIDE  __X____18.   Wear clean and comfortable clothing to the hospital.                      HAVE A SHIRT WITH WIDE OR STRETCHY SLEEVES/CUFF.  PLEASE BRING CELL PHONE NUMBER FOR YOUR CONTACT PERSON.

## 2020-04-29 ENCOUNTER — Other Ambulatory Visit: Payer: Commercial Managed Care - PPO

## 2020-05-01 ENCOUNTER — Ambulatory Visit
Admission: RE | Admit: 2020-05-01 | Payer: Commercial Managed Care - PPO | Source: Home / Self Care | Admitting: Orthopedic Surgery

## 2020-05-01 ENCOUNTER — Encounter: Admission: RE | Payer: Self-pay | Source: Home / Self Care

## 2020-05-01 SURGERY — CARPAL TUNNEL RELEASE
Anesthesia: Choice | Site: Wrist | Laterality: Right

## 2020-05-30 DIAGNOSIS — Z9181 History of falling: Secondary | ICD-10-CM | POA: Insufficient documentation

## 2020-06-02 NOTE — Discharge Instructions (Signed)
?  Instructions after Hand / Wrist Surgery ? ? Kona Lover P. Marlet Korte, Jr., M.D. ? Dept. of Orthopaedics & Sports Medicine ? [facility] ? [address] ? [place], [place]  [address] ? ? Phone: [phone]   Fax: [phone] ? ? ?DIET: ?Drink plenty of non-alcoholic fluids & begin a light diet. ?Resume your normal diet the day after surgery. ? ?ACTIVITY:  ?Keep the hand elevated above the level of the elbow. ?Begin gently moving the fingers on a regular basis to avoid stiffness. ?Avoid any heavy lifting, pushing, or pulling with the operative hand. ?Do not drive or operate any equipment until instructed. ? ?WOUND CARE:  ?Keep the splint/bandage clean and dry.  ?The splint and stitches will be removed in the office. ?Continue to use the ice packs periodically to reduce pain and swelling. ?You may bathe or shower after the stitches are removed at the first office visit following surgery. ? ?MEDICATIONS: ?You may resume your regular medications. ?Please take the pain medication as prescribed. ?Do not take pain medication on an empty stomach. ?Do not drive or drink alcoholic beverages when taking pain medications. ? ?CALL THE OFFICE FOR: ?Temperature above 101 degrees ?Excessive bleeding or drainage on the dressing. ?Excessive swelling, coldness, or paleness of the fingers. ?Persistent nausea and vomiting. ? ?FOLLOW-UP:  ?You should have an appointment to return to the office in 7-10 days after surgery.  ? ?REMEMBER: R.I.C.E. = Rest, Ice, Compression, Elevation !  ? ? ? ?[facility] ?Department Directory ?     ? ? ? ?[email or web address] ? ? ?  ? ? ?[email or web address] ?  ? ?      ?Cardiology ? ?Appointments: ?[place] - [phone] ?[place] - [phone]  Endocrinology ? ?Appointments: ?[place] - [phone] ?[place] - [phone]  Gastroenterology ? ?Appointments: ?[place] - [phone] ?[place] - [phone]  ?      ?General Surgery ? ? ?Appointments: ?[place] -  [phone]  Internal Medicine/Family Medicine ? ?Appointments: ?[place] - [phone] ?[place] - [phone] ?[place] - [phone]  Metabolic and Weigh Loss Surgery ? ?Appointments: ?[place] - [phone]  ?      ?Neurology ? ?Appointments: ?[place] - [phone] ?[place] - [phone]  Neurosurgery ? ?Appointments: ?[place] - [phone]  Obstetrics & Gynecology ? ?Appointments: ?[place] - [phone] ?[place] - [phone]  ?      ?Pediatrics ? ?Appointments: ?[place] - [phone] ?[place] - [phone]  Physiatry ? ?Appointments: ?[place] -[phone]  Physical Therapy ? ?Appointments: ?[place] - [phone] ?[place] - [phone]  ?      ?Podiatry ? ?Appointments: ?[place] - [phone] ?[place] - [phone]  Pulmonology ? ?Appointments: ?[place] - [phone]  Rheumatology ? ?Appointments: ?[place] - [phone]  ?      ?[place] Location: ?[facility]  ?[address] ?, [place]  [address]  [place] Location: ?[facility] ?[address] ?[place], [place]  [address]  [place] Location: ?[facility] ?[address] ?[place], [place]  [address]  ?  ?

## 2020-06-05 ENCOUNTER — Other Ambulatory Visit: Payer: Self-pay

## 2020-06-05 ENCOUNTER — Encounter
Admission: RE | Admit: 2020-06-05 | Discharge: 2020-06-05 | Disposition: A | Discharge status: Home / Self Care | Payer: Commercial Managed Care - PPO | Source: Ambulatory Visit | Attending: Orthopedic Surgery | Admitting: Orthopedic Surgery

## 2020-06-05 DIAGNOSIS — Z01818 Encounter for other preprocedural examination: Secondary | ICD-10-CM | POA: Insufficient documentation

## 2020-06-05 DIAGNOSIS — Z01810 Encounter for preprocedural cardiovascular examination: Secondary | ICD-10-CM | POA: Diagnosis not present

## 2020-06-05 LAB — CBC
HCT: 46.6 % (ref 39.0–52.0)
Hemoglobin: 15.3 g/dL (ref 13.0–17.0)
MCH: 31.8 pg (ref 26.0–34.0)
MCHC: 32.8 g/dL (ref 30.0–36.0)
MCV: 96.9 fL (ref 80.0–100.0)
Platelets: 206 10*3/uL (ref 150–400)
RBC: 4.81 MIL/uL (ref 4.22–5.81)
RDW: 14 % (ref 11.5–15.5)
WBC: 7.5 10*3/uL (ref 4.0–10.5)
nRBC: 0 % (ref 0.0–0.2)

## 2020-06-05 LAB — BASIC METABOLIC PANEL
Anion gap: 13 (ref 5–15)
BUN: 26 mg/dL — ABNORMAL HIGH (ref 8–23)
CO2: 26 mmol/L (ref 22–32)
Calcium: 9.9 mg/dL (ref 8.9–10.3)
Chloride: 104 mmol/L (ref 98–111)
Creatinine, Ser: 1.5 mg/dL — ABNORMAL HIGH (ref 0.61–1.24)
GFR, Estimated: 53 mL/min — ABNORMAL LOW (ref >60)
Glucose, Bld: 102 mg/dL — ABNORMAL HIGH (ref 70–99)
Potassium: 4 mmol/L (ref 3.5–5.1)
Sodium: 143 mmol/L (ref 135–145)

## 2020-06-10 ENCOUNTER — Other Ambulatory Visit
Admission: RE | Admit: 2020-06-10 | Discharge: 2020-06-10 | Disposition: A | Discharge status: Home / Self Care | Payer: Commercial Managed Care - PPO | Source: Ambulatory Visit | Attending: Orthopedic Surgery | Admitting: Orthopedic Surgery

## 2020-06-10 ENCOUNTER — Other Ambulatory Visit: Payer: Self-pay

## 2020-06-10 DIAGNOSIS — U071 COVID-19: Secondary | ICD-10-CM | POA: Insufficient documentation

## 2020-06-10 DIAGNOSIS — Z01812 Encounter for preprocedural laboratory examination: Secondary | ICD-10-CM | POA: Diagnosis not present

## 2020-06-10 LAB — SARS CORONAVIRUS 2 (TAT 6-24 HRS): SARS Coronavirus 2: POSITIVE — AB

## 2020-06-11 ENCOUNTER — Telehealth: Payer: Self-pay

## 2020-06-11 NOTE — Telephone Encounter (Signed)
Called to discuss with patient about COVID-19 symptoms and the use of one of the available treatments for those with mild to moderate Covid symptoms and at a high risk of hospitalization.  Pt appears to qualify for outpatient treatment due to co-morbid conditions and/or a member of an at-risk group in accordance with the FDA Emergency Use Authorization.    Symptom onset: No symptoms Vaccinated: No Booster? No Immunocompromised? No Qualifiers: HTN  Pt. States he thinks he had COVID 19 in January, but no symptoms now.  Keith Rogers

## 2020-06-30 ENCOUNTER — Encounter: Payer: Self-pay | Admitting: Orthopedic Surgery

## 2020-06-30 NOTE — H&P (Signed)
ORTHOPAEDIC HISTORY & PHYSICAL [name], [name] - 06/06/2020 11:00 AM EST Formatting of this note is different from the original. [facility] Chief Complaint:   Chief Complaint  Patient presents with  . Wrist Pain  H & P RIGHT WRIST   History of Present Illness:   Keith Rogers is a 60 y.o. male that presents to clinic today for his preoperative history and evaluation. Patient presents unaccompanied. The patient is scheduled to undergo a right carpal tunnel release on 06/12/20 by [name]. The patient reports a long history of numbness and paresthesias in the right hand as well as decreased grip strength. Patient denies any history of injury to the hand.   The patient's symptoms have progressed to the point that they decrease his quality of life. The patient has previously undergone conservative treatment including NSAIDS, splinting, and activity modification without adequate control of his symptoms.  Of note the patient is being treated by [name] for cervical radiculitis. He has received epidural steroid injections.  Past Medical, Surgical, Family, Social History, Allergies, Medications:   Past Medical History:  Past Medical History:  Diagnosis Date  . Arthritis  bilateral knees  . Diabetes mellitus type 2, uncomplicated (CMS-HCC)  history of, resolved with weight loss  . GERD (gastroesophageal reflux disease)  . Hyperlipidemia  resolved with diet and weight loss  . Hypertension  resolved with weight loss   Past Surgical History:  Past Surgical History:  Procedure Laterality Date  . APPENDECTOMY   Current Medications:  Current Outpatient Medications  Medication Sig Dispense Refill  . celecoxib (CELEBREX) 100 MG capsule Take 1 capsule (100 mg total) by mouth 2 (two) times daily 60 capsule 1  . diclofenac (VOLTAREN) 50 MG EC tablet Take 50 mg by mouth 2 (two) times daily  . escitalopram oxalate (LEXAPRO) 10 MG  tablet Take 10 mg by mouth once daily  . gabapentin (NEURONTIN) 300 MG capsule 1 tablet prn for pain. May take every 8 hours if pain persists.  . hydroCHLOROthiazide (HYDRODIURIL) 12.5 MG tablet Take 12.5 mg by mouth once daily  . multivitamin with minerals tablet Take 1 tablet by mouth once daily  . propranolol (INDERAL) 10 MG tablet Take 10 mg by mouth 2 (two) times daily  . tamsulosin (FLOMAX) 0.4 mg capsule Take 0.4 mg by mouth once daily  . tiZANidine (ZANAFLEX) 4 MG tablet Take 1 tablet (4 mg total) by mouth 3 (three) times daily as needed for Muscle spasms 60 tablet 1  . traMADol-acetaminophen (ULTRACET) 37.5-325 mg tablet TAKE 1 TO 2 TABLETS BY MOUTH EVERY 6 HOURS AS NEEDED FOR PAIN  . VENTOLIN HFA 90 mcg/actuation inhaler Inhale 2 inhalations into the lungs every 6 (six) hours as needed   No current facility-administered medications for this visit.   Allergies: No Known Allergies  Social History:  Social History   Socioeconomic History  . Marital status: Single  Spouse name: Not on file  . Number of children: 0  . Years of education: 50  . Highest education level: High school graduate  Occupational History  . Occupation: Full-time - Systems analyst  Tobacco Use  . Smoking status: Former Smoker  Packs/day: 2.00  Years: 40.00  Pack years: 80.00  Types: Cigarettes  Quit date: 09/18/2019  Years since quitting: 0.7  . Smokeless tobacco: Never Used  Vaping Use  . Vaping Use: Never used  Substance and Sexual Activity  . Alcohol use: Yes  Comment: occasional  .  Drug use: No  . Sexual activity: Yes  Partners: Female  Comment: partner infertility  Other Topics Concern  . Not on file  Social History Narrative  . Not on file   Social Determinants of Health   Financial Resource Strain: Not on file  Food Insecurity: Not on file  Transportation Needs: Not on file  Physical Activity: Not on file  Stress: Not on file  Social Connections: Not on file  Housing Stability:  Not on file   Family History:  Family History  Problem Relation Age of Onset  . High blood pressure (Hypertension) Father  . Coronary Artery Disease (Blocked arteries around heart) Father  . Cancer Father  secondary to agent orange exposure   Review of Systems:   A 10+ ROS was performed, reviewed, and the pertinent orthopaedic findings are documented in the HPI.   Physical Examination:   BP (!) 140/98 (BP Location: Left upper arm, Patient Position: Sitting, BP Cuff Size: Large Adult)  Ht 177.8 cm (5\' 10" )  Wt (!) 151.1 kg (333 lb 3.2 oz)  BMI 47.81 kg/m   Patient is a well-developed, well-nourished male in no acute distress. Patient has normal mood and affect. Patient is alert and oriented to person, place, and time.   HEENT: Atraumatic, normocephalic. Pupils equal and reactive to light. Extraocular motion intact. Noninjected sclera.  Cardiovascular: Regular rate and rhythm, with no murmurs, rubs, or gallops. Distal pulses palpable.  Respiratory: Lungs clear to auscultation bilaterally.   Right hand:  Tenderness: Negative Erythema: negative Swelling: negative Capillary Refill: normal Thenar atrophy: positive Intrinsic wasting: negative Grip strength: poor to fair grip strength Pincer strength: poor to fair pincer strength Tinel`s test: positive Phalen`s test: positive Compression test: positive Triggering: No gross triggering or locking of the digits Finkelstein`s test: negative Range of motion: Good range of motion of the digits  Sensation decreased to pinprick over the median nerve distribution, intact to pinprick over the radial and ulnar nerve distribution. Patient able to make a thumbs up, okay sign, and crisscross the second and third digits. Radial pulse 2+  Tests Performed/Reviewed:  X-rays  No new radiographs were obtained today.   EMG/NCV Test 03/10/20 There is electrical evidence of median sensorimotor peripheral neuropathy characteristic of carpal  tunnel syndrome. Findings are consistent with severe (grade 4) degree of involvement on the right and moderate (grade 3) involvement on the left.   MRI CERVICAL SPINE WITHOUT CONTRAST   TECHNIQUE:  Multiplanar, multisequence MR imaging of the cervical spine was  performed. No intravenous contrast was administered.   COMPARISON: 12/01/2019 cervical spine radiographs report.   FINDINGS:  Alignment: Reversal of cervical lordosis. Grade 1 C2-3, C3-4  anterolisthesis.   Vertebrae: C4-5 Modic type 2 endplate degenerative changes with  prominent anterior osteophytosis. Partial fusion of the C5-6  vertebral bodies. Vertebral body heights are preserved.   Cord: Normal signal and morphology.   Posterior Fossa, vertebral arteries: Negative.   Disc levels: Multilevel desiccation and disc space loss most  prominent at the C4-6 levels.   C2-3: Disc osteophyte complex. Prominent right subarticular  osteophyte abutting the ventral cord. Uncovertebral and bilateral  facet degenerative spurring. Mild spinal canal, moderate right and  mild left neural foraminal narrowing.   C3-4: Disc osteophyte complex with superimposed central protrusion,  uncovertebral and facet hypertrophy. Mild spinal canal, mild right  and moderate to severe left neural foraminal narrowing.   C4-5: Disc osteophyte complex with uncovertebral andfacet  hypertrophy. Mild spinal canal and bilateral neural foraminal  narrowing.   C5-6: Bilateral uncovertebral and facet degenerative spurring.  Patent spinal canal. Mild bilateral neural foraminal narrowing.   C6-7: Small disc osteophyte complex with small central protrusion  and uncovertebral hypertrophy. Patent spinal canal. Mild bilateral  neural foraminal narrowing.   C7-T1: Disc osteophyte complex with superimposed right foraminal  protrusion. Uncovertebral and facet hypertrophy. Partial effacement  of the ventral CSF containing spaces. Mild bilateral neural   foraminal narrowing.   Paraspinal tissues: Negative.   IMPRESSION:  Multilevel spondylosis with reversal of lordosis. Bulky C4-5  anterior osteophytosis.   Moderate to severe right C2-3 and left C3-4 neural foraminal  narrowing.   Mild spinal canal narrowing at the C2-5 levels.   Mild left C2-3, right C3-4 and bilateral C4-T1 neural foraminal  narrowing.   Electronically Signed  By: Stana Bunting M.D.  On: 10/18/202113:36   Impression:   ICD-10-CM  1. Carpal tunnel syndrome on right G56.01   Plan:   -Carpal tunnel syndrome, severe The patient will benefit from a right carpal tunnel release. Having failed conservative treatment, the patient has elected to proceed with surgery. The risks and postoperative course of surgery were discussed with the patient. Patient was instructed to stop all blood thinners prior to surgery. Patient understands and elects to proceed with surgery.  Contact our office with any questions or concerns. Follow up as indicated, or sooner should any new problems arise, if conditions worsen, or if they are otherwise concerned.   [name], PA-C [facility] [address] [place], [place] [address] Phone: [phone]  This note was generated in part with voice recognition software and I apologize for any typographical errors that were not detected and corrected.   Electronically signed by [name], PA at 06/06/2020 12:29 PM EST

## 2020-07-01 ENCOUNTER — Encounter
Admission: RE | Disposition: A | Discharge status: Home / Self Care | Payer: Self-pay | Source: Home / Self Care | Attending: Orthopedic Surgery

## 2020-07-01 ENCOUNTER — Ambulatory Visit: Payer: Commercial Managed Care - PPO | Admitting: Urgent Care

## 2020-07-01 ENCOUNTER — Ambulatory Visit
Admission: RE | Admit: 2020-07-01 | Discharge: 2020-07-01 | Disposition: A | Discharge status: Home / Self Care | Payer: Commercial Managed Care - PPO | Attending: Orthopedic Surgery | Admitting: Orthopedic Surgery

## 2020-07-01 ENCOUNTER — Encounter: Payer: Self-pay | Admitting: Orthopedic Surgery

## 2020-07-01 ENCOUNTER — Other Ambulatory Visit: Payer: Self-pay

## 2020-07-01 DIAGNOSIS — G5601 Carpal tunnel syndrome, right upper limb: Secondary | ICD-10-CM | POA: Insufficient documentation

## 2020-07-01 DIAGNOSIS — Z791 Long term (current) use of non-steroidal anti-inflammatories (NSAID): Secondary | ICD-10-CM | POA: Diagnosis not present

## 2020-07-01 DIAGNOSIS — Z87891 Personal history of nicotine dependence: Secondary | ICD-10-CM | POA: Diagnosis not present

## 2020-07-01 DIAGNOSIS — Z9889 Other specified postprocedural states: Secondary | ICD-10-CM

## 2020-07-01 DIAGNOSIS — Z79899 Other long term (current) drug therapy: Secondary | ICD-10-CM | POA: Insufficient documentation

## 2020-07-01 HISTORY — PX: CARPAL TUNNEL RELEASE: SHX101

## 2020-07-01 SURGERY — CARPAL TUNNEL RELEASE
Anesthesia: General | Site: Wrist | Laterality: Right

## 2020-07-01 MED ORDER — FENTANYL CITRATE (PF) 100 MCG/2ML IJ SOLN: INTRAMUSCULAR | Status: DC | PRN

## 2020-07-01 MED ORDER — METOCLOPRAMIDE HCL 5 MG/ML IJ SOLN: 5.0000 mg | Freq: Three times a day (TID) | INTRAMUSCULAR | Status: DC | PRN

## 2020-07-01 MED ORDER — FENTANYL CITRATE (PF) 100 MCG/2ML IJ SOLN: 25.0000 ug | INTRAMUSCULAR | Status: DC | PRN

## 2020-07-01 MED ORDER — CHLORHEXIDINE GLUCONATE 0.12 % MT SOLN: 15.0000 mL | Freq: Once | OROMUCOSAL | Status: DC

## 2020-07-01 MED ORDER — PROPOFOL 10 MG/ML IV BOLUS: INTRAVENOUS | Status: DC | PRN

## 2020-07-01 MED ORDER — NEOMYCIN-POLYMYXIN B GU 40-200000 IR SOLN
Status: DC | PRN
  Administered 2020-07-01: 2 mL

## 2020-07-01 MED ORDER — MIDAZOLAM HCL 2 MG/2ML IJ SOLN
INTRAMUSCULAR | Status: DC | PRN
  Administered 2020-07-01: 2 mg via INTRAVENOUS

## 2020-07-01 MED ORDER — ONDANSETRON HCL 4 MG/2ML IJ SOLN: 4.0000 mg | Freq: Four times a day (QID) | INTRAMUSCULAR | Status: DC | PRN

## 2020-07-01 MED ORDER — ORAL CARE MOUTH RINSE: 15.0000 mL | Freq: Once | OROMUCOSAL | Status: DC

## 2020-07-01 MED ORDER — MIDAZOLAM HCL 2 MG/2ML IJ SOLN
INTRAMUSCULAR | Status: AC
  Filled 2020-07-01: qty 2

## 2020-07-01 MED ORDER — FENTANYL CITRATE (PF) 100 MCG/2ML IJ SOLN
INTRAMUSCULAR | Status: AC
  Filled 2020-07-01: qty 2

## 2020-07-01 MED ORDER — PROPOFOL 500 MG/50ML IV EMUL
INTRAVENOUS | Status: DC | PRN
  Administered 2020-07-01: 100 ug/kg/min via INTRAVENOUS

## 2020-07-01 MED ORDER — ONDANSETRON HCL 4 MG PO TABS: 4.0000 mg | ORAL_TABLET | Freq: Four times a day (QID) | ORAL | Status: DC | PRN

## 2020-07-01 MED ORDER — PROPOFOL 10 MG/ML IV BOLUS
INTRAVENOUS | Status: AC
  Filled 2020-07-01: qty 20

## 2020-07-01 MED ORDER — ACETAMINOPHEN 10 MG/ML IV SOLN
INTRAVENOUS | Status: DC | PRN
  Administered 2020-07-01: 1000 mg via INTRAVENOUS

## 2020-07-01 MED ORDER — ACETAMINOPHEN 325 MG PO TABS: 325.0000 mg | ORAL_TABLET | Freq: Four times a day (QID) | ORAL | Status: DC | PRN

## 2020-07-01 MED ORDER — FAMOTIDINE 20 MG PO TABS: 20.0000 mg | ORAL_TABLET | Freq: Once | ORAL | Status: DC

## 2020-07-01 MED ORDER — HYDROCODONE-ACETAMINOPHEN 5-325 MG PO TABS: 1.0000 | ORAL_TABLET | ORAL | 0 refills | Status: DC | PRN

## 2020-07-01 MED ORDER — FENTANYL CITRATE (PF) 100 MCG/2ML IJ SOLN
INTRAMUSCULAR | Status: DC | PRN
  Administered 2020-07-01: 50 ug via INTRAVENOUS
  Administered 2020-07-01: 100 ug via INTRAVENOUS
  Administered 2020-07-01: 50 ug via INTRAVENOUS

## 2020-07-01 MED ORDER — BUPIVACAINE HCL (PF) 0.25 % IJ SOLN
INTRAMUSCULAR | Status: AC
  Filled 2020-07-01: qty 30

## 2020-07-01 MED ORDER — HYDROCODONE-ACETAMINOPHEN 7.5-325 MG PO TABS: 1.0000 | ORAL_TABLET | ORAL | Status: DC | PRN

## 2020-07-01 MED ORDER — BUPIVACAINE HCL (PF) 0.25 % IJ SOLN
INTRAMUSCULAR | Status: DC | PRN
  Administered 2020-07-01: 8 mL

## 2020-07-01 MED ORDER — LIDOCAINE HCL (PF) 0.5 % IJ SOLN
INTRAMUSCULAR | Status: DC | PRN
  Administered 2020-07-01: 30 mL via INTRAVENOUS

## 2020-07-01 MED ORDER — METOCLOPRAMIDE HCL 10 MG PO TABS: 5.0000 mg | ORAL_TABLET | Freq: Three times a day (TID) | ORAL | Status: DC | PRN

## 2020-07-01 MED ORDER — MORPHINE SULFATE (PF) 2 MG/ML IV SOLN: 0.5000 mg | INTRAVENOUS | Status: DC | PRN

## 2020-07-01 MED ORDER — CELECOXIB 200 MG PO CAPS: 400.0000 mg | ORAL_CAPSULE | Freq: Once | ORAL | Status: AC

## 2020-07-01 MED ORDER — PROPOFOL 500 MG/50ML IV EMUL
INTRAVENOUS | Status: AC
  Filled 2020-07-01: qty 50

## 2020-07-01 MED ORDER — CELECOXIB 200 MG PO CAPS
ORAL_CAPSULE | ORAL | Status: AC
  Administered 2020-07-01: 400 mg via ORAL
  Filled 2020-07-01: qty 2

## 2020-07-01 MED ORDER — FAMOTIDINE 20 MG PO TABS
ORAL_TABLET | ORAL | Status: AC
  Administered 2020-07-01: 20 mg
  Filled 2020-07-01: qty 1

## 2020-07-01 MED ORDER — ONDANSETRON HCL 4 MG/2ML IJ SOLN: 4.0000 mg | Freq: Once | INTRAMUSCULAR | Status: DC | PRN

## 2020-07-01 MED ORDER — PROPOFOL 10 MG/ML IV BOLUS
INTRAVENOUS | Status: DC | PRN
  Administered 2020-07-01: 30 mg via INTRAVENOUS
  Administered 2020-07-01: 50 mg via INTRAVENOUS

## 2020-07-01 MED ORDER — LACTATED RINGERS IV SOLN: INTRAVENOUS | Status: DC

## 2020-07-01 MED ORDER — LIDOCAINE HCL (PF) 1 % IJ SOLN
INTRAMUSCULAR | Status: AC
  Filled 2020-07-01: qty 30

## 2020-07-01 MED ORDER — CHLORHEXIDINE GLUCONATE 0.12 % MT SOLN
OROMUCOSAL | Status: AC
  Filled 2020-07-01: qty 15

## 2020-07-01 MED ORDER — HYDROCODONE-ACETAMINOPHEN 5-325 MG PO TABS: 1.0000 | ORAL_TABLET | ORAL | Status: DC | PRN

## 2020-07-01 SURGICAL SUPPLY — 29 items
BNDG CMPR STD VLCR NS LF 5.8X3 (GAUZE/BANDAGES/DRESSINGS) ×1
BNDG ELASTIC 3X5.8 VLCR NS LF (GAUZE/BANDAGES/DRESSINGS) ×2 IMPLANT
BNDG ESMARK 4X12 TAN STRL LF (GAUZE/BANDAGES/DRESSINGS) ×2 IMPLANT
CANISTER SUCT 1200ML W/VALVE (MISCELLANEOUS) ×2 IMPLANT
CAST PADDING 3X4FT ST 30246 (SOFTGOODS) ×1
COVER WAND RF STERILE (DRAPES) ×2 IMPLANT
CUFF TOURN SGL QUICK 18X4 (TOURNIQUET CUFF) ×2 IMPLANT
DRSG DERMACEA 8X12 NADH (GAUZE/BANDAGES/DRESSINGS) ×2 IMPLANT
DURAPREP 26ML APPLICATOR (WOUND CARE) ×2 IMPLANT
ELECT CAUTERY BLADE 6.4 (BLADE) ×2 IMPLANT
ELECT REM PT RETURN 9FT ADLT (ELECTROSURGICAL) ×2
ELECTRODE REM PT RTRN 9FT ADLT (ELECTROSURGICAL) ×1 IMPLANT
GAUZE SPONGE 4X4 12PLY STRL (GAUZE/BANDAGES/DRESSINGS) ×2 IMPLANT
GLOVE INDICATOR 8.0 STRL GRN (GLOVE) ×2 IMPLANT
GLOVE SURG ENC TEXT LTX SZ7.5 (GLOVE) ×2 IMPLANT
GOWN STRL REUS W/ TWL LRG LVL3 (GOWN DISPOSABLE) ×2 IMPLANT
GOWN STRL REUS W/TWL LRG LVL3 (GOWN DISPOSABLE) ×4
KIT TURNOVER KIT A (KITS) ×2 IMPLANT
MANIFOLD NEPTUNE II (INSTRUMENTS) ×2 IMPLANT
NS IRRIG 500ML POUR BTL (IV SOLUTION) ×2 IMPLANT
PACK EXTREMITY ARMC (MISCELLANEOUS) ×2 IMPLANT
PAD CAST CTTN 3X4 STRL (SOFTGOODS) ×1 IMPLANT
PADDING CAST COTTON 3X4 STRL (SOFTGOODS) ×1
SOL PREP PVP 2OZ (MISCELLANEOUS) ×2
SOLUTION PREP PVP 2OZ (MISCELLANEOUS) ×1 IMPLANT
SPLINT CAST 1 STEP 3X12 (MISCELLANEOUS) ×2 IMPLANT
STOCKINETTE 48X4 2 PLY STRL (GAUZE/BANDAGES/DRESSINGS) ×1 IMPLANT
STOCKINETTE STRL 4IN 9604848 (GAUZE/BANDAGES/DRESSINGS) ×2 IMPLANT
SUT ETHILON 5-0 FS-2 18 BLK (SUTURE) ×2 IMPLANT

## 2020-07-01 NOTE — Op Note (Signed)
OPERATIVE NOTE  DATE OF SURGERY:  07/01/2020  PATIENT NAME:  Keith Rogers   DOB: [birth year]  MRN: [id number]  PRE-OPERATIVE DIAGNOSIS: Right carpal tunnel syndrome  POST-OPERATIVE DIAGNOSIS:  Same  PROCEDURE:  Right carpal tunnel release  SURGEON:  Jena Gauss. M.D.  ANESTHESIA: Bier block  ESTIMATED BLOOD LOSS: None  FLUIDS REPLACED: 800 mL of crystalloid  TOURNIQUET TIME: 51 minutes  DRAINS: None  INDICATIONS FOR SURGERY: [name] is a 60 y.o. year old male with a long history of numbness and paresthesias to the right hand. EMG/nerve conduction studies demonstrated findings consistent with carpal tunnel syndrome.The patient had not seen any significant improvement despite conservative nonsurgical intervention. After discussion of the risks and benefits of surgical intervention, the patient expressed understanding of the risks benefits and agree with plans for carpal tunnel release.   PROCEDURE IN DETAIL: The patient was brought into the operating room and after adequate regional anesthesia via Bier block the patient's right hand and arm were prepped with alcohol and Duraprep and draped in the usual sterile fashion. A "time-out" was performed as per usual protocol. The hand and forearm were exsanguinated using an Esmarch and the tourniquet was inflated to 250 mmHg. Loupe magnification was used throughout the procedure. An incision was made just ulnar to the thenar palmar crease. Dissection was carried down through the palmar fascia to the transverse carpal ligament. The transverse carpal ligament was sharply incised, taking care to protect the underlying structures with the carpal tunnel. Complete release of the transverse carpal ligament was achieved. There was no evidence of ganglion cyst or lipoma within the carpal tunnel. The wound was irrigated with copious amounts of normal saline with antibiotic solution. The skin was then re-approximated with interrupted sutures of #5-0  nylon. A sterile dressing was applied followed by application of a volar splint. The tourniquet was deflated with a total tourniquet time of 51 minutes.  The patient tolerated the procedure well and was transported to the PACU in stable condition.  Cheri Ayotte P. Angie Fava., M.D.

## 2020-07-01 NOTE — Anesthesia Procedure Notes (Signed)
Date/Time: 07/01/2020 3:15 PM Performed by: Junious Silk, CRNA Pre-anesthesia Checklist: Patient identified, Emergency Drugs available, Suction available, Patient being monitored and Timeout performed Oxygen Delivery Method: Simple face mask

## 2020-07-01 NOTE — Anesthesia Preprocedure Evaluation (Signed)
Anesthesia Evaluation  Patient identified by MRN, date of birth, ID band Patient awake    Reviewed: Allergy & Precautions, H&P , NPO status , Patient's Chart, lab work & pertinent test results, reviewed documented beta blocker date and time   Airway Mallampati: II  TM Distance: >3 FB Neck ROM: full    Dental  (+) Teeth Intact   Pulmonary asthma , former smoker,    Pulmonary exam normal        Cardiovascular Exercise Tolerance: Poor hypertension, On Medications negative cardio ROS Normal cardiovascular exam Rate:Normal     Neuro/Psych PSYCHIATRIC DISORDERS Depression  Neuromuscular disease    GI/Hepatic negative GI ROS, Neg liver ROS,   Endo/Other  negative endocrine ROS  Renal/GU negative Renal ROS  negative genitourinary   Musculoskeletal   Abdominal   Peds  Hematology negative hematology ROS (+)   Anesthesia Other Findings   Reproductive/Obstetrics negative OB ROS                             Anesthesia Physical Anesthesia Plan  ASA: III  Anesthesia Plan: General LMA   Post-op Pain Management:    Induction:   PONV Risk Score and Plan:   Airway Management Planned:   Additional Equipment:   Intra-op Plan:   Post-operative Plan:   Informed Consent: I have reviewed the patients History and Physical, chart, labs and discussed the procedure including the risks, benefits and alternatives for the proposed anesthesia with the patient or authorized representative who has indicated his/her understanding and acceptance.       Plan Discussed with: CRNA  Anesthesia Plan Comments:         Anesthesia Quick Evaluation

## 2020-07-01 NOTE — H&P (Signed)
The patient has been re-examined, and the chart reviewed, and there have been no interval changes to the documented history and physical.    The risks, benefits, and alternatives have been discussed at length. The patient expressed understanding of the risks benefits and agreed with plans for surgical intervention.  Carlos Heber P. Kuuipo Anzaldo, Jr. M.D.    

## 2020-07-01 NOTE — Anesthesia Procedure Notes (Signed)
Anesthesia Regional Block: Bier block (IV Regional)   Pre-Anesthetic Checklist: ,, timeout performed, Correct Patient, Correct Site, Correct Laterality, Correct Procedure, Correct Position, site marked, Risks and benefits discussed,  Surgical consent,  Pre-op evaluation,  At surgeon's request and post-op pain management  Laterality: Right  Prep: alcohol swabs       Needles:  Injection technique: Single-shot      Needle Gauge: 22     Additional Needles:   Procedures:,,,,, intact distal pulses, Esmarch exsanguination,, #20gu IV placed and double tourniquet utilized  Narrative:  Injection made incrementally with aspirations every 5 mL.  Performed by: With CRNAs  Anesthesiologist: Karleen Hampshire, MD CRNA: Junious Silk, CRNA  Additional Notes: The patient tolerated the procedure well.

## 2020-07-01 NOTE — Transfer of Care (Signed)
Immediate Anesthesia Transfer of Care Note  Patient: Keith Rogers  Procedure(s) Performed: CARPAL TUNNEL RELEASE (Right Wrist)  Patient Location: PACU  Anesthesia Type:Bier block  Level of Consciousness: awake, alert  and oriented  Airway & Oxygen Therapy: Patient Spontanous Breathing and Patient connected to face mask oxygen  Post-op Assessment: Report given to RN and Post -op Vital signs reviewed and stable  Post vital signs: Reviewed and stable  Last Vitals:  Vitals Value Taken Time  BP 120/96 07/01/20 1645  Temp    Pulse 68 07/01/20 1646  Resp    SpO2 100 % 07/01/20 1646  Vitals shown include unvalidated device data.  Last Pain:  Vitals:   07/01/20 1353  TempSrc: Temporal  PainSc: 8          Complications: No complications documented.

## 2020-07-02 ENCOUNTER — Encounter: Payer: Self-pay | Admitting: Orthopedic Surgery

## 2020-07-02 NOTE — Anesthesia Postprocedure Evaluation (Signed)
Anesthesia Post Note  Patient: Keith Rogers  Procedure(s) Performed: CARPAL TUNNEL RELEASE (Right Wrist)  Patient location during evaluation: PACU Anesthesia Type: General Level of consciousness: awake and alert Pain management: pain level controlled Vital Signs Assessment: post-procedure vital signs reviewed and stable Respiratory status: spontaneous breathing, nonlabored ventilation and respiratory function stable Cardiovascular status: blood pressure returned to baseline and stable Postop Assessment: no apparent nausea or vomiting Anesthetic complications: no   No complications documented.   Last Vitals:  Vitals:   07/01/20 1724 07/01/20 1727  BP: (!) 152/92 (!) 139/91  Pulse: 64   Resp: 18   Temp: (!) 36.1 C   SpO2: 100%     Last Pain:  Vitals:   07/01/20 1724  TempSrc: Temporal  PainSc: 0-No pain                 Aurelio Brash Ramy Greth

## 2020-07-10 ENCOUNTER — Encounter: Payer: Self-pay | Admitting: Student in an Organized Health Care Education/Training Program

## 2020-07-11 ENCOUNTER — Ambulatory Visit
Payer: Commercial Managed Care - PPO | Attending: Student in an Organized Health Care Education/Training Program | Admitting: Student in an Organized Health Care Education/Training Program

## 2020-07-11 ENCOUNTER — Other Ambulatory Visit: Payer: Self-pay

## 2020-07-11 DIAGNOSIS — M5412 Radiculopathy, cervical region: Secondary | ICD-10-CM | POA: Diagnosis not present

## 2020-07-11 DIAGNOSIS — G894 Chronic pain syndrome: Secondary | ICD-10-CM | POA: Diagnosis not present

## 2020-07-11 DIAGNOSIS — M47816 Spondylosis without myelopathy or radiculopathy, lumbar region: Secondary | ICD-10-CM | POA: Diagnosis not present

## 2020-07-11 DIAGNOSIS — M5136 Other intervertebral disc degeneration, lumbar region: Secondary | ICD-10-CM | POA: Diagnosis not present

## 2020-07-11 NOTE — Progress Notes (Signed)
Patient: Keith Rogers  Service Category: E/M  Provider: Gillis Santa, MD  DOB: May 05, 1960  DOS: 07/11/2020  Location: Office  MRN: [id number]  Setting: Ambulatory outpatient  Referring Provider: Romualdo Bolk, FNP  Type: Established Patient  Specialty: Interventional Pain Management  PCP: Romualdo Bolk, FNP  Location: Home  Delivery: TeleHealth     Virtual Encounter - Pain Management PROVIDER NOTE: Information contained herein reflects review and annotations entered in association with encounter. Interpretation of such information and data should be left to medically-trained personnel. Information provided to patient can be located elsewhere in the medical record under "Patient Instructions". Document created using STT-dictation technology, any transcriptional errors that may result from process are unintentional.    Contact & Pharmacy Preferred: [phone] Home: [phone] (home) Mobile: [phone] (mobile) E-mail: [email or web address]  [facility] [place], [place] [facility] [address] [place] [address] Phone: [phone] Fax: [phone]   Pre-screening  Keith Rogers offered "in-person" vs "virtual" encounter. He indicated preferring virtual for this encounter.   Reason COVID-19*  Social distancing based on CDC and AMA recommendations.   I contacted Keith Rogers on 07/11/2020 via video conference.      I clearly identified myself as Gillis Santa, MD. I verified that I was speaking with the correct person using two identifiers (Name: Keith Rogers, and date of birth: [birth year]).  Consent I sought verbal advanced consent from Keith Rogers for virtual visit interactions. I informed Keith Rogers of possible security and privacy concerns, risks, and limitations associated with providing "not-in-person" medical evaluation and management services. I also informed Keith Rogers of the availability of "in-person" appointments. Finally, I informed him that there would be  a charge for the virtual visit and that he could be  personally, fully or partially, financially responsible for it. Keith Rogers expressed understanding and agreed to proceed.   Historic Elements   Keith Rogers is a 60 y.o. year old, male patient evaluated today after our last contact on 04/24/2020. Keith Rogers  has a past medical history of Arthritis, Asthma, Cervical radicular pain, Depression, Hypertension, and Obesity. He also  has a past surgical history that includes Appendectomy; Epidural block injection; and Carpal tunnel release (Right, 07/01/2020). Keith Rogers has a current medication list which includes the following prescription(s): albuterol, diclofenac, escitalopram, gabapentin, hydrochlorothiazide, hydrocodone-acetaminophen, multivitamin with minerals, propranolol, aerochamber plus, tramadol-acetaminophen, and celecoxib. He  reports that he quit smoking about 10 months ago. His smoking use included cigarettes. He smoked 1.00 pack per day. He has never used smokeless tobacco. He reports current alcohol use. He reports that he does not use drugs. Keith Rogers has No Known Allergies.   HPI  Today, he is being contacted for worsening of previously known (established) problem  Increased low back pain that is axial.  This was also noted on his visit on 04/01/2020.  At that time, he was scheduled for bilateral lumbar facet medial branch nerve blocks, diagnostic however he had to reschedule because he contracted Covid and was sick for 3 to 4 weeks.  He has now recovered.  Patient has also undergone right carpal tunnel release for carpal tunnel syndrome.  He would like to proceed with the lumbar spinal injections and we will get him scheduled for those hopefully in the next couple of weeks.  Patient has a history of cervical radicular pain status post cervical ESI 02/26/2020.  He is still experiencing pain relief from this injection.  Advised patient that we could repeat cervical  ESI if he has return of his  cervical radicular pain.   Laboratory Chemistry Profile   Renal Lab Results  Component Value Date   BUN 26 (H) 06/05/2020   CREATININE 1.50 (H) 06/05/2020   GFRAA >60 10/27/2017   GFRNONAA 53 (L) 06/05/2020     Hepatic Lab Results  Component Value Date   AST 27 10/27/2017   ALT 22 10/27/2017   ALBUMIN 4.5 10/27/2017   ALKPHOS 63 10/27/2017     Electrolytes Lab Results  Component Value Date   NA 143 06/05/2020   K 4.0 06/05/2020   CL 104 06/05/2020   CALCIUM 9.9 06/05/2020     Bone No results found for: VD25OH, VD125OH2TOT, KG4010UV2, ZD6644IH4, 25OHVITD1, 25OHVITD2, 25OHVITD3, TESTOFREE, TESTOSTERONE   Inflammation (CRP: Acute Phase) (ESR: Chronic Phase) No results found for: CRP, ESRSEDRATE, LATICACIDVEN     Note: Above Lab results reviewed.  Imaging   EXAM: XR LUMBAR SPINE 2 OR 3 VIEWS  DATE: 03/14/2018 12:43 PM  ACCESSION: [id number]  DICTATED: 03/14/2018 1:35 PM  INTERPRETATION LOCATION: [place]   CLINICAL INDICATION: 60 years old Male with low back pain with radicular symptoms - M54.42 - Acute left - sided low back pain with left - sided sciatica    COMPARISON: None available.   TECHNIQUE: AP and lateral views of the lumbar spine.   FINDINGS:  The L4-L5 intervertebral disc spaces the proximal level of the iliac crests.   No acute fracture identified. Likely degenerative retrolisthesis of L2 on L3 and L3 on L4. Multilevel moderate to severe intervertebral disc space narrowing, most prominent at L4-L5 and L5-S1. Prominent multilevel anterior endplate osteophytosis and endplate sclerosis. Multilevel facet arthropathy, most prominent at L4-S1.   Assessment  The primary encounter diagnosis was Lumbar facet arthropathy. Diagnoses of Lumbar degenerative disc disease, Cervical radicular pain, and Chronic pain syndrome were also pertinent to this visit.  Plan of Care   1. Cervical radicular pain -Status post cervical epidural steroid injection on  02/26/2020.  Significant pain relief and improvement in functional status.  Repeat as needed. - Cervical Epidural Injection; Standing  2. Low back pain secondary to Lumbar facet arthropathy Keith Rogers has a history of greater than 3 months of moderate to severe pain which is resulted in functional impairment.  The patient has tried various conservative therapeutic options such as NSAIDs, Tylenol, muscle relaxants, physical therapy which was inadequately effective.  Patient's pain is predominantly axial with physical exam findings (+pain with facet loading and lumbar extension) and xray findings above suggestive of facet arthropathy.  Lumbar facet medial branch nerve blocks were discussed with the patient.  Risks and benefits were reviewed.  Patient would like to proceed with bilateral L3, L4, L5 medial branch nerve block.  - LUMBAR FACET(MEDIAL BRANCH NERVE BLOCK) MBNB; Future  3. Chronic pain syndrome - Cervical Epidural Injection; Standing - LUMBAR FACET(MEDIAL BRANCH NERVE BLOCK) MBNB; Future  Orders:  Orders Placed This Encounter  Procedures  . LUMBAR FACET(MEDIAL BRANCH NERVE BLOCK) MBNB    Standing Status:   Future    Standing Expiration Date:   08/11/2020    Scheduling Instructions:     Procedure: Lumbar facet block (AKA.: Lumbosacral medial branch nerve block)     Side: Bilateral     Level: L3-4, L4-5, Facets (L3, L4, L5, edial Branch Nerves)     Sedation: Patient's choice.     Timeframe: ASAA    Order Specific Question:   Where will this procedure be performed?  Answer:   [facility]   Follow-up plan:   Return in about 1 week (around 07/18/2020) for B/L L3-5 Fcts #1 , with sedation.     Status post midline C7-T1 ESI on 02/26/2020: 90 to 100% pain relief and improvement in functional status.  Now having increased low back pain related to lumbar facet arthropathy and spondylosis.  Plan for diagnostic lumbar facet medial branch nerve blocks bilaterally at L3, L4, L5.     Recent Visits No visits were found meeting these conditions. Showing recent visits within past 90 days and meeting all other requirements Today's Visits Date Type Provider Dept  07/11/20 Telemedicine Gillis Santa, MD [facility]  Showing today's visits and meeting all other requirements Future Appointments No visits were found meeting these conditions. Showing future appointments within next 90 days and meeting all other requirements  I discussed the assessment and treatment plan with the patient. The patient was provided an opportunity to ask questions and all were answered. The patient agreed with the plan and demonstrated an understanding of the instructions.  Patient advised to call back or seek an in-person evaluation if the symptoms or condition worsens.  Duration of encounter:20 minutes.  Note by: Gillis Santa, MD Date: 07/11/2020; Time: 12:45 PM

## 2020-07-17 ENCOUNTER — Other Ambulatory Visit: Payer: Self-pay

## 2020-07-17 ENCOUNTER — Ambulatory Visit (HOSPITAL_BASED_OUTPATIENT_CLINIC_OR_DEPARTMENT_OTHER): Payer: Commercial Managed Care - PPO | Admitting: Student in an Organized Health Care Education/Training Program

## 2020-07-17 ENCOUNTER — Encounter: Payer: Self-pay | Admitting: Student in an Organized Health Care Education/Training Program

## 2020-07-17 ENCOUNTER — Ambulatory Visit: Payer: Commercial Managed Care - PPO | Admitting: Student in an Organized Health Care Education/Training Program

## 2020-07-17 ENCOUNTER — Ambulatory Visit
Admission: RE | Admit: 2020-07-17 | Discharge: 2020-07-17 | Disposition: A | Discharge status: Home / Self Care | Payer: Commercial Managed Care - PPO | Source: Ambulatory Visit | Attending: Student in an Organized Health Care Education/Training Program | Admitting: Student in an Organized Health Care Education/Training Program

## 2020-07-17 VITALS — BP 127/82 | HR 99 | Temp 97.9°F | Resp 18 | Ht 72.0 in | Wt 334.0 lb

## 2020-07-17 DIAGNOSIS — G894 Chronic pain syndrome: Secondary | ICD-10-CM

## 2020-07-17 DIAGNOSIS — M1288 Other specific arthropathies, not elsewhere classified, other specified site: Secondary | ICD-10-CM | POA: Insufficient documentation

## 2020-07-17 DIAGNOSIS — M47816 Spondylosis without myelopathy or radiculopathy, lumbar region: Secondary | ICD-10-CM | POA: Diagnosis not present

## 2020-07-17 MED ORDER — ROPIVACAINE HCL 2 MG/ML IJ SOLN
9.0000 mL | Freq: Once | INTRAMUSCULAR | Status: AC
  Administered 2020-07-17: 9 mL via PERINEURAL

## 2020-07-17 MED ORDER — DEXAMETHASONE SODIUM PHOSPHATE 10 MG/ML IJ SOLN
10.0000 mg | Freq: Once | INTRAMUSCULAR | Status: AC
  Administered 2020-07-17: 10 mg

## 2020-07-17 MED ORDER — LIDOCAINE HCL 2 % IJ SOLN
INTRAMUSCULAR | Status: AC
  Filled 2020-07-17: qty 20

## 2020-07-17 MED ORDER — ROPIVACAINE HCL 2 MG/ML IJ SOLN
INTRAMUSCULAR | Status: AC
  Filled 2020-07-17: qty 20

## 2020-07-17 MED ORDER — DIAZEPAM 5 MG PO TABS
5.0000 mg | ORAL_TABLET | Freq: Once | ORAL | Status: AC
  Administered 2020-07-17: 5 mg via ORAL

## 2020-07-17 MED ORDER — DEXAMETHASONE SODIUM PHOSPHATE 10 MG/ML IJ SOLN
INTRAMUSCULAR | Status: AC
  Filled 2020-07-17: qty 2

## 2020-07-17 MED ORDER — DIAZEPAM 5 MG PO TABS
ORAL_TABLET | ORAL | Status: AC
  Filled 2020-07-17: qty 1

## 2020-07-17 MED ORDER — LIDOCAINE HCL 2 % IJ SOLN
20.0000 mL | Freq: Once | INTRAMUSCULAR | Status: AC
  Administered 2020-07-17: 400 mg

## 2020-07-17 NOTE — Patient Instructions (Signed)
Pain Management Discharge Instructions  General Discharge Instructions :  If you need to reach your doctor call: Monday-Friday 8:00 am - 4:00 pm at [phone] or toll free [phone].  After clinic hours [phone] to have operator reach doctor.  Bring all of your medication bottles to all your appointments in the pain clinic.  To cancel or reschedule your appointment with Pain Management please remember to call 24 hours in advance to avoid a fee.  Refer to the educational materials which you have been given on: General Risks, I had my Procedure. Discharge Instructions, Post Sedation.  Post Procedure Instructions:  The drugs you were given will stay in your system until tomorrow, so for the next 24 hours you should not drive, make any legal decisions or drink any alcoholic beverages.  You may eat anything you prefer, but it is better to start with liquids then soups and crackers, and gradually work up to solid foods.  Please notify your doctor immediately if you have any unusual bleeding, trouble breathing or pain that is not related to your normal pain.  Depending on the type of procedure that was done, some parts of your body may feel week and/or numb.  This usually clears up by tonight or the next day.  Walk with the use of an assistive device or accompanied by an adult for the 24 hours.  You may use ice on the affected area for the first 24 hours.  Put ice in a Ziploc bag and cover with a towel and place against area 15 minutes on 15 minutes off.  You may switch to heat after 24 hours.Facet Blocks Patient Information  Description: The facets are joints in the spine between the vertebrae.  Like any joints in the body, facets can become irritated and painful.  Arthritis can also effect the facets.  By injecting steroids and local anesthetic in and around these joints, we can temporarily block the nerve supply to them.  Steroids act directly on irritated nerves and tissues to  reduce selling and inflammation which often leads to decreased pain.  Facet blocks may be done anywhere along the spine from the neck to the low back depending upon the location of your pain.   After numbing the skin with local anesthetic (like Novocaine), a small needle is passed onto the facet joints under x-ray guidance.  You may experience a sensation of pressure while this is being done.  The entire block usually lasts about 15-25 minutes.   Conditions which may be treated by facet blocks:   Low back/buttock pain  Neck/shoulder pain  Certain types of headaches  Preparation for the injection:  1. Do not eat any solid food or dairy products within 8 hours of your appointment. 2. You may drink clear liquid up to 3 hours before appointment.  Clear liquids include water, black coffee, juice or soda.  No milk or cream please. 3. You may take your regular medication, including pain medications, with a sip of water before your appointment.  Diabetics should hold regular insulin (if taken separately) and take 1/2 normal NPH dose the morning of the procedure.  Carry some sugar containing items with you to your appointment. 4. A driver must accompany you and be prepared to drive you home after your procedure. 5. Bring all your current medications with you. 6. An IV may be inserted and sedation may be given at the discretion of the physician. 7. A blood pressure cuff, EKG and other monitors will often be   applied during the procedure.  Some patients may need to have extra oxygen administered for a short period. 8. You will be asked to provide medical information, including your allergies and medications, prior to the procedure.  We must know immediately if you are taking blood thinners (like Coumadin/Warfarin) or if you are allergic to IV iodine contrast (dye).  We must know if you could possible be pregnant.  Possible side-effects:   Bleeding from needle site  Infection (rare, may require  surgery)  Nerve injury (rare)  Numbness & tingling (temporary)  Difficulty urinating (rare, temporary)  Spinal headache (a headache worse with upright posture)  Light-headedness (temporary)  Pain at injection site (serveral days)  Decreased blood pressure (rare, temporary)  Weakness in arm/leg (temporary)  Pressure sensation in back/neck (temporary)   Call if you experience:   Fever/chills associated with headache or increased back/neck pain  Headache worsened by an upright position  New onset, weakness or numbness of an extremity below the injection site  Hives or difficulty breathing (go to the emergency room)  Inflammation or drainage at the injection site(s)  Severe back/neck pain greater than usual  New symptoms which are concerning to you  Please note:  Although the local anesthetic injected can often make your back or neck feel good for several hours after the injection, the pain will likely return. It takes 3-7 days for steroids to work.  You may not notice any pain relief for at least one week.  If effective, we will often do a series of 2-3 injections spaced 3-6 weeks apart to maximally decrease your pain.  After the initial series, you may be a candidate for a more permanent nerve block of the facets.  If you have any questions, please call [phone] [facility] 

## 2020-07-17 NOTE — Progress Notes (Signed)
Safety precautions to be maintained throughout the outpatient stay will include: orient to surroundings, keep bed in low position, maintain call bell within reach at all times, provide assistance with transfer out of bed and ambulation.  

## 2020-07-17 NOTE — Progress Notes (Signed)
PROVIDER NOTE: Information contained herein reflects review and annotations entered in association with encounter. Interpretation of such information and data should be left to medically-trained personnel. Information provided to patient can be located elsewhere in the medical record under "Patient Instructions". Document created using STT-dictation technology, any transcriptional errors that may result from process are unintentional.    Patient: Keith Rogers  Service Category: Procedure  Provider: Edward Jolly, MD  DOB: 1961/04/20  DOS: 07/17/2020  Location: [facility]  MRN: [id number]  Setting: Ambulatory - outpatient  Referring Provider: Olena Leatherwood, FNP  Type: Established Patient  Specialty: Interventional Pain Management  PCP: Olena Leatherwood, FNP   Primary Reason for Visit: Interventional Pain Management Treatment. CC: Back Pain (Lumbar bilateral ) and Knee Pain (Bilateral )  Procedure:          Anesthesia, Analgesia, Anxiolysis:  Type: Lumbar Facet, Medial Branch Block(s) #1  Primary Purpose: Diagnostic Region: Posterolateral Lumbosacral Spine Level: L3, L4, L5, Medial Branch Level(s). Injecting these levels blocks the L3-4, L4-5, and L5-S1 lumbar facet joints. Laterality: Bilateral  Type: Local Anesthesia with p.o. Valium  Local Anesthetic: Lidocaine 1-2%  Position: Prone   Indications: 1. Lumbar facet arthropathy   2. Chronic pain syndrome    Pain Score: Pre-procedure: 8 /10 Post-procedure: 1 /10   Pre-op H&P Assessment:  [name] is a 60 y.o. (year old), male patient, seen today for interventional treatment. He  has a past surgical history that includes Appendectomy; Epidural block injection; and Carpal tunnel release (Right, 07/01/2020). [name] has a current medication list which includes the following prescription(s): albuterol, diclofenac, escitalopram, gabapentin, hydrochlorothiazide, multivitamin with minerals, propranolol, aerochamber plus,  tramadol-acetaminophen, celecoxib, and hydrocodone-acetaminophen. His primarily concern today is the Back Pain (Lumbar bilateral ) and Knee Pain (Bilateral )  Initial Vital Signs:  Pulse/HCG Rate: 99ECG Heart Rate: (!) 101 Temp: 97.9 F (36.6 C) Resp: 16 BP: (!) 126/93 SpO2: 99 %  BMI: Estimated body mass index is 45.3 kg/m as calculated from the following:   Height as of this encounter: 6' (1.829 m).   Weight as of this encounter: 334 lb (151.5 kg).  Risk Assessment: Allergies: Reviewed. He has No Known Allergies.  Allergy Precautions: None required Coagulopathies: Reviewed. None identified.  Blood-thinner therapy: None at this time Active Infection(s): Reviewed. None identified. [name] is afebrile  Site Confirmation: [name] was asked to confirm the procedure and laterality before marking the site Procedure checklist: Completed Consent: Before the procedure and under the influence of no sedative(s), amnesic(s), or anxiolytics, the patient was informed of the treatment options, risks and possible complications. To fulfill our ethical and legal obligations, as recommended by the American Medical Association's Code of Ethics, I have informed the patient of my clinical impression; the nature and purpose of the treatment or procedure; the risks, benefits, and possible complications of the intervention; the alternatives, including doing nothing; the risk(s) and benefit(s) of the alternative treatment(s) or procedure(s); and the risk(s) and benefit(s) of doing nothing. The patient was provided information about the general risks and possible complications associated with the procedure. These may include, but are not limited to: failure to achieve desired goals, infection, bleeding, organ or nerve damage, allergic reactions, paralysis, and death. In addition, the patient was informed of those risks and complications associated to Spine-related procedures, such as failure to decrease pain;  infection (i.e.: Meningitis, epidural or intraspinal abscess); bleeding (i.e.: epidural hematoma, subarachnoid hemorrhage, or any other type of intraspinal or peri-dural bleeding);  organ or nerve damage (i.e.: Any type of peripheral nerve, nerve root, or spinal cord injury) with subsequent damage to sensory, motor, and/or autonomic systems, resulting in permanent pain, numbness, and/or weakness of one or several areas of the body; allergic reactions; (i.e.: anaphylactic reaction); and/or death. Furthermore, the patient was informed of those risks and complications associated with the medications. These include, but are not limited to: allergic reactions (i.e.: anaphylactic or anaphylactoid reaction(s)); adrenal axis suppression; blood sugar elevation that in diabetics may result in ketoacidosis or comma; water retention that in patients with history of congestive heart failure may result in shortness of breath, pulmonary edema, and decompensation with resultant heart failure; weight gain; swelling or edema; medication-induced neural toxicity; particulate matter embolism and blood vessel occlusion with resultant organ, and/or nervous system infarction; and/or aseptic necrosis of one or more joints. Finally, the patient was informed that Medicine is not an exact science; therefore, there is also the possibility of unforeseen or unpredictable risks and/or possible complications that may result in a catastrophic outcome. The patient indicated having understood very clearly. We have given the patient no guarantees and we have made no promises. Enough time was given to the patient to ask questions, all of which were answered to the patient's satisfaction. [name] has indicated that he wanted to continue with the procedure. Attestation: I, the ordering provider, attest that I have discussed with the patient the benefits, risks, side-effects, alternatives, likelihood of achieving goals, and potential problems during  recovery for the procedure that I have provided informed consent. Date  Time: 07/17/2020 12:58 PM  Pre-Procedure Preparation:  Monitoring: As per clinic protocol. Respiration, ETCO2, SpO2, BP, heart rate and rhythm monitor placed and checked for adequate function Safety Precautions: Patient was assessed for positional comfort and pressure points before starting the procedure. Time-out: I initiated and conducted the "Time-out" before starting the procedure, as per protocol. The patient was asked to participate by confirming the accuracy of the "Time Out" information. Verification of the correct person, site, and procedure were performed and confirmed by me, the nursing staff, and the patient. "Time-out" conducted as per Joint Commission's Universal Protocol (UP.01.01.01). Time: 1338  Description of Procedure:          Laterality: Bilateral. The procedure was performed in identical fashion on both sides. Levels:   L3, L4, L5,  Medial Branch Level(s) Area Prepped: Posterior Lumbosacral Region DuraPrep (Iodine Povacrylex [0.7% available iodine] and Isopropyl Alcohol, 74% w/w) Safety Precautions: Aspiration looking for blood return was conducted prior to all injections. At no point did we inject any substances, as a needle was being advanced. Before injecting, the patient was told to immediately notify me if he was experiencing any new onset of "ringing in the ears, or metallic taste in the mouth". No attempts were made at seeking any paresthesias. Safe injection practices and needle disposal techniques used. Medications properly checked for expiration dates. SDV (single dose vial) medications used. After the completion of the procedure, all disposable equipment used was discarded in the proper designated medical waste containers. Local Anesthesia: Protocol guidelines were followed. The patient was positioned over the fluoroscopy table. The area was prepped in the usual manner. The time-out was completed.  The target area was identified using fluoroscopy. A 12-in long, straight, sterile hemostat was used with fluoroscopic guidance to locate the targets for each level blocked. Once located, the skin was marked with an approved surgical skin marker. Once all sites were marked, the skin (epidermis, dermis, and hypodermis),  as well as deeper tissues (fat, connective tissue and muscle) were infiltrated with a small amount of a short-acting local anesthetic, loaded on a 10cc syringe with a 25G, 1.5-in  Needle. An appropriate amount of time was allowed for local anesthetics to take effect before proceeding to the next step. Local Anesthetic: Lidocaine 2.0% The unused portion of the local anesthetic was discarded in the proper designated containers.  Technical explanation of process:  L3 Medial Branch Nerve Block (MBB): The target area for the L3 medial branch is at the junction of the postero-lateral aspect of the superior articular process and the superior, posterior, and medial edge of the transverse process of L4. Under fluoroscopic guidance, a Quincke needle was inserted until contact was made with os over the superior postero-lateral aspect of the pedicular shadow (target area). After negative aspiration for blood,62mL of the nerve block solution was injected without difficulty or complication. The needle was removed intact. L4 Medial Branch Nerve Block (MBB): The target area for the L4 medial branch is at the junction of the postero-lateral aspect of the superior articular process and the superior, posterior, and medial edge of the transverse process of L5. Under fluoroscopic guidance, a Quincke needle was inserted until contact was made with os over the superior postero-lateral aspect of the pedicular shadow (target area). After negative aspiration for blood, 2mL of the nerve block solution was injected without difficulty or complication. The needle was removed intact. L5 Medial Branch Nerve Block (MBB): The  target area for the L5 medial branch is at the junction of the postero-lateral aspect of the superior articular process and the superior, posterior, and medial edge of the sacral ala. Under fluoroscopic guidance, a Quincke needle was inserted until contact was made with os over the superior postero-lateral aspect of the pedicular shadow (target area). After negative aspiration for blood, 2mL of the nerve block solution was injected without difficulty or complication. The needle was removed intact.   Nerve block solution: 12 cc solution made of 10 cc of 0.2% ropivacaine, 2 cc Decadron 10 mg/cc.  2 cc injected at each level above bilaterally.  Total steroid dose = 20 mg of Decadron. Procedural Needles: 22-gauge, 5-inch, Quincke needles used for all levels.  Once the entire procedure was completed, the treated area was cleaned, making sure to leave some of the prepping solution back to take advantage of its long term bactericidal properties.      Illustration of the posterior view of the lumbar spine and the posterior neural structures. Laminae of L2 through S1 are labeled. DPRL5, dorsal primary ramus of L5; DPRS1, dorsal primary ramus of S1; DPR3, dorsal primary ramus of L3; FJ, facet (zygapophyseal) joint L3-L4; I, inferior articular process of L4; LB1, lateral branch of dorsal primary ramus of L1; IAB, inferior articular branches from L3 medial branch (supplies L4-L5 facet joint); IBP, intermediate branch plexus; MB3, medial branch of dorsal primary ramus of L3; NR3, third lumbar nerve root; S, superior articular process of L5; SAB, superior articular branches from L4 (supplies L4-5 facet joint also); TP3, transverse process of L3.  Vitals:   07/17/20 1338 07/17/20 1343 07/17/20 1349 07/17/20 1354  BP: 125/85 138/87 122/90 127/82  Pulse:      Resp: Temp:      TempSrc:      SpO2: 97% 95% 96% 95%  Weight:      Height:         Start Time: 1338 hrs. End Time: 1353  hrs.  Imaging  Guidance (Spinal):          Type of Imaging Technique: Fluoroscopy Guidance (Spinal) Indication(s): Assistance in needle guidance and placement for procedures requiring needle placement in or near specific anatomical locations not easily accessible without such assistance. Exposure Time: Please see nurses notes. Contrast: None used. Fluoroscopic Guidance: I was personally present during the use of fluoroscopy. "Tunnel Vision Technique" used to obtain the best possible view of the target area. Parallax error corrected before commencing the procedure. "Direction-depth-direction" technique used to introduce the needle under continuous pulsed fluoroscopy. Once target was reached, antero-posterior, oblique, and lateral fluoroscopic projection used confirm needle placement in all planes. Images permanently stored in EMR. Interpretation: No contrast injected. I personally interpreted the imaging intraoperatively. Adequate needle placement confirmed in multiple planes. Permanent images saved into the patient's record.   Post-operative Assessment:  Post-procedure Vital Signs:  Pulse/HCG Rate: 9998 Temp: 97.9 F (36.6 C) Resp: 18 BP: 127/82 SpO2: 95 %  EBL: None  Complications: No immediate post-treatment complications observed by team, or reported by patient.  Note: The patient tolerated the entire procedure well. A repeat set of vitals were taken after the procedure and the patient was kept under observation following institutional policy, for this type of procedure. Post-procedural neurological assessment was performed, showing return to baseline, prior to discharge. The patient was provided with post-procedure discharge instructions, including a section on how to identify potential problems. Should any problems arise concerning this procedure, the patient was given instructions to immediately contact us, at any time, without hesitation. In any case, we plan to contact the patient by telephone for a  follow-up status report regarding this interventional procedure.  Comments:  No additional relevant information.  Plan of Care  Orders:  Orders Placed This Encounter  Procedures  . DG PAIN CLIN[facility] C-ARM 1-60 MIN NO REPORT    Intraoperative interpretation by procedural physician at [facility].    Standing Status:   Standing    Number of Occurrences:   1    Order Specific Question:   Reason for exam:    Answer:   Assistance in needle guidance and placement for procedures requiring needle placement in or near specific anatomical locations not easily accessible without such assistance.   Medications ordered for procedure: Meds ordered this encounter  Medications  . lidocaine (XYLOCAINE) 2 % (with pres) injection 400 mg  . ropivacaine (PF) 2 mg/mL (0.2%) (NAROPIN) injection 9 mL  . ropivacaine (PF) 2 mg/mL (0.2%) (NAROPIN) injection 9 mL  . dexamethasone (DECADRON) injection 10 mg  . dexamethasone (DECADRON) injection 10 mg  . diazepam (VALIUM) tablet 5 mg   Medications administered: We administered lidocaine, ropivacaine (PF) 2 mg/mL (0.2%), ropivacaine (PF) 2 mg/mL (0.2%), dexamethasone, dexamethasone, and diazepam.  See the medical record for exact dosing, route, and time of administration.  Follow-up plan:   Return in about 4 weeks (around 08/14/2020) for Post Procedure Evaluation, virtual.      Status post midline C7-T1 ESI on 02/26/2020: 90 to 100% pain relief and improvement in functional status.  Now having increased low back pain related to lumbar facet arthropathy and spondylosis.  Plan for diagnostic lumbar facet medial branch nerve blocks bilaterally at L3, L4, L5.     Recent Visits Date Type Provider Dept  07/11/20 Telemedicine Edward Jolly, MD [facility]  Showing recent visits within past 90 days and meeting all other requirements Today's Visits Date Type Provider Dept  07/17/20 Procedure visit Edward Jolly,  MD [facility]  Showing  today's visits and meeting all other requirements Future Appointments Date Type Provider Dept  08/13/20 Appointment Edward Jolly, MD [facility]  Showing future appointments within next 90 days and meeting all other requirements  Disposition: Discharge home  Discharge (Date  Time): 07/17/2020; 1403 hrs.   Primary Care Physician: Olena Leatherwood, FNP Location: [facility] Note by: Edward Jolly, MD Date: 07/17/2020; Time: 2:43 PM  Disclaimer:  Medicine is not an exact science. The only guarantee in medicine is that nothing is guaranteed. It is important to note that the decision to proceed with this intervention was based on the information collected from the patient. The Data and conclusions were drawn from the patient's questionnaire, the interview, and the physical examination. Because the information was provided in large part by the patient, it cannot be guaranteed that it has not been purposely or unconsciously manipulated. Every effort has been made to obtain as much relevant data as possible for this evaluation. It is important to note that the conclusions that lead to this procedure are derived in large part from the available data. Always take into account that the treatment will also be dependent on availability of resources and existing treatment guidelines, considered by other Pain Management Practitioners as being common knowledge and practice, at the time of the intervention. For Medico-Legal purposes, it is also important to point out that variation in procedural techniques and pharmacological choices are the acceptable norm. The indications, contraindications, technique, and results of the above procedure should only be interpreted and judged by a Board-Certified Interventional Pain Specialist with extensive familiarity and expertise in the same exact procedure and technique.

## 2020-07-18 ENCOUNTER — Telehealth: Payer: Self-pay | Admitting: *Deleted

## 2020-07-18 NOTE — Telephone Encounter (Signed)
Called patient post procedure and he is doing fine. Will call if he has any questions or concerns.

## 2020-08-13 ENCOUNTER — Ambulatory Visit
Payer: Commercial Managed Care - PPO | Attending: Student in an Organized Health Care Education/Training Program | Admitting: Student in an Organized Health Care Education/Training Program

## 2020-08-13 ENCOUNTER — Other Ambulatory Visit: Payer: Self-pay

## 2020-08-13 ENCOUNTER — Encounter: Payer: Self-pay | Admitting: Student in an Organized Health Care Education/Training Program

## 2020-08-13 DIAGNOSIS — G894 Chronic pain syndrome: Secondary | ICD-10-CM | POA: Diagnosis not present

## 2020-08-13 DIAGNOSIS — M5442 Lumbago with sciatica, left side: Secondary | ICD-10-CM

## 2020-08-13 DIAGNOSIS — M5136 Other intervertebral disc degeneration, lumbar region: Secondary | ICD-10-CM | POA: Diagnosis not present

## 2020-08-13 DIAGNOSIS — M5441 Lumbago with sciatica, right side: Secondary | ICD-10-CM

## 2020-08-13 DIAGNOSIS — M47816 Spondylosis without myelopathy or radiculopathy, lumbar region: Secondary | ICD-10-CM

## 2020-08-13 DIAGNOSIS — G8929 Other chronic pain: Secondary | ICD-10-CM

## 2020-08-13 NOTE — Progress Notes (Signed)
Patient: Keith Rogers  Service Category: E/M  Provider: Gillis Santa, MD  DOB: Apr 25, 1960  DOS: 08/13/2020  Location: Office  MRN: [id number]  Setting: Ambulatory outpatient  Referring Provider: Romualdo Bolk, FNP  Type: Established Patient  Specialty: Interventional Pain Management  PCP: Romualdo Bolk, FNP  Location: Home  Delivery: TeleHealth     Virtual Encounter - Pain Management PROVIDER NOTE: Information contained herein reflects review and annotations entered in association with encounter. Interpretation of such information and data should be left to medically-trained personnel. Information provided to patient can be located elsewhere in the medical record under "Patient Instructions". Document created using STT-dictation technology, any transcriptional errors that may result from process are unintentional.    Contact & Pharmacy Preferred: [phone] Home: [phone] (home) Mobile: [phone] (mobile) E-mail: [email or web address]  [facility] [place] [place] [facility] [address] [place] [address] Phone: [phone] Fax: [phone]   Pre-screening  Keith Rogers offered "in-person" vs "virtual" encounter. He indicated preferring virtual for this encounter.   Reason COVID-19*  Social distancing based on CDC and AMA recommendations.   I contacted Keith Rogers on 08/13/2020 via video conference.      I clearly identified myself as Gillis Santa, MD. I verified that I was speaking with the correct person using two identifiers (Name: Keith Rogers, and date of birth: 29-Oct-1960).  Consent I sought verbal advanced consent from Keith Rogers for virtual visit interactions. I informed Keith Rogers of possible security and privacy concerns, risks, and limitations associated with providing "not-in-person" medical evaluation and management services. I also informed Keith Rogers of the availability of "in-person" appointments. Finally, I informed him that there would be  a charge for the virtual visit and that he could be  personally, fully or partially, financially responsible for it. Keith Rogers expressed understanding and agreed to proceed.   Historic Elements   Keith Rogers is a 60 y.o. year old, male patient evaluated today after our last contact on 07/17/2020. Keith Rogers  has a past medical history of Arthritis, Asthma, Cervical radicular pain, Depression, Hypertension, and Obesity. He also  has a past surgical history that includes Appendectomy; Epidural block injection; and Carpal tunnel release (Right, 07/01/2020). Keith Rogers has a current medication list which includes the following prescription(s): albuterol, diclofenac, escitalopram, gabapentin, hydrochlorothiazide, multivitamin with minerals, propranolol, aerochamber plus, and tramadol-acetaminophen. He  reports that he quit smoking about 11 months ago. His smoking use included cigarettes. He smoked 1.00 pack per day. He has never used smokeless tobacco. He reports current alcohol use. He reports that he does not use drugs. Keith Rogers has No Known Allergies.   HPI  Today, he is being contacted for a post-procedure assessment.   Post-Procedure Evaluation  Procedure (07/17/2020):   Type: Lumbar Facet, Medial Branch Block(s) #1  Primary Purpose: Diagnostic Region: Posterolateral Lumbosacral Spine Level: L3, L4, L5, Medial Branch Level(s). Injecting these levels blocks the L3-4, L4-5, and L5-S1 lumbar facet joints. Laterality: Bilateral  Sedation: Please see nurses note.  Effectiveness during initial hour after procedure(Ultra-Short Term Relief): 100 %  Local anesthetic used: Long-acting (4-6 hours) Effectiveness: Defined as any analgesic benefit obtained secondary to the administration of local anesthetics. This carries significant diagnostic value as to the etiological location, or anatomical origin, of the pain. Duration of benefit is expected to coincide with the duration of the local anesthetic used.   Effectiveness during initial 4-6 hours after procedure(Short-Term Relief): 100 %   Long-term benefit: Defined as  any relief past the pharmacologic duration of the local anesthetics.  Effectiveness past the initial 6 hours after procedure(Long-Term Relief): 50 % (4 days)   Current benefits: Defined as benefit that persist at this time.   Analgesia:  Back to baseline Function: No benefit ROM: No benefit   Laboratory Chemistry Profile   Renal Lab Results  Component Value Date   BUN 26 (H) 06/05/2020   CREATININE 1.50 (H) 06/05/2020   GFRAA >60 10/27/2017   GFRNONAA 53 (L) 06/05/2020     Hepatic Lab Results  Component Value Date   AST 27 10/27/2017   ALT 22 10/27/2017   ALBUMIN 4.5 10/27/2017   ALKPHOS 63 10/27/2017     Electrolytes Lab Results  Component Value Date   NA 143 06/05/2020   K 4.0 06/05/2020   CL 104 06/05/2020   CALCIUM 9.9 06/05/2020     Bone No results found for: VD25OH, VD125OH2TOT, GN0037CW8, GQ9169IH0, 25OHVITD1, 25OHVITD2, 25OHVITD3, TESTOFREE, TESTOSTERONE   Inflammation (CRP: Acute Phase) (ESR: Chronic Phase) No results found for: CRP, ESRSEDRATE, LATICACIDVEN     Note: Above Lab results reviewed.  Assessment  The primary encounter diagnosis was Chronic bilateral low back pain with bilateral sciatica. Diagnoses of Lumbar facet arthropathy, Lumbar degenerative disc disease, Chronic pain syndrome, and Other intervertebral disc degeneration, lumbar region were also pertinent to this visit.  Plan of Care    Patient complaining of persistent low back pain with intermittent radiation into bilateral lower extremities in a dermatomal fashion.  Unfortunately, he did not obtain as much pain relief as we had hoped from his diagnostic lumbar facet medial branch nerve blocks as there was evidence of lumbar facet arthropathy and lumbar spondylosis on previous lumbar spine x-rays.  Given that the patient is continue to have low back pain that is radiating,  recommend lumbar MRI without contrast to evaluate for neuroforaminal, canal stenosis that could be contributing to his symptoms.  Depending upon results, patient may be a candidate for a lumbar epidural steroid injection.  He has done physical therapy in the past for his low back with limited benefit.  He is on Voltaren 50 mg twice a day along with gabapentin 300 mg twice daily for pain management and takes tramadol as needed.  I will place an order for MRI of lumbar spine.  Will discuss results with patient and review treatment plan afterwards which may include a series of lumbar epidural steroid injections..  Orders:  Orders Placed This Encounter  Procedures  . MR LUMBAR SPINE WO CONTRAST    Patient presents with axial pain with possible radicular component.  In addition to any acute findings, please report on:  1. Facet (Zygapophyseal) joint DJD (Hypertrophy, space narrowing, subchondral sclerosis, and/or osteophyte formation) 2. DDD and/or IVDD (Loss of disc height, desiccation or "Black disc disease") 3. Pars defects 4. Spondylolisthesis, spondylosis, and/or spondyloarthropathies (include Degree/Grade of displacement in mm) 5. Vertebral body Fractures, including age (old, new/acute) 10. Modic Type Changes 7. Demineralization 8. Bone pathology 9. Central, Lateral Recess, and/or Foraminal Stenosis (include AP diameter of stenosis in mm) 10. Surgical changes (hardware type, status, and presence of fibrosis)  NOTE: Please specify level(s) and laterality.    Standing Status:   Future    Standing Expiration Date:   11/12/2020    Order Specific Question:   What is the patient's sedation requirement?    Answer:   No Sedation    Order Specific Question:   Does the patient have a pacemaker or implanted  devices?    Answer:   No    Order Specific Question:   Preferred imaging location?    Answer:   [facility] (table limit-350lbs)    Order Specific Question:   Call Results- Best  Contact Number?    Answer:   [phone] ([facility])    Order Specific Question:   Radiology Contrast Protocol - do NOT remove file path    Answer:   [email or web address]   Follow-up plan:   Return for Will call patient with MRI results.     Status post midline C7-T1 ESI on 02/26/2020: 90 to 100% pain relief and improvement in functional status.  Now having increased low back pain related to lumbar facet arthropathy and spondylosis.  diagnostic lumbar facet medial branch nerve blocks bilaterally at L3, L4, L5 07/17/2020- did not help.      Recent Visits Date Type Provider Dept  07/17/20 Procedure visit Gillis Santa, MD [facility]  07/11/20 Telemedicine Gillis Santa, MD [facility]  Showing recent visits within past 90 days and meeting all other requirements Today's Visits Date Type Provider Dept  08/13/20 Telemedicine Gillis Santa, MD [facility]  Showing today's visits and meeting all other requirements Future Appointments No visits were found meeting these conditions. Showing future appointments within next 90 days and meeting all other requirements  I discussed the assessment and treatment plan with the patient. The patient was provided an opportunity to ask questions and all were answered. The patient agreed with the plan and demonstrated an understanding of the instructions.  Patient advised to call back or seek an in-person evaluation if the symptoms or condition worsens.  Duration of encounter: 20 minutes.  Note by: Gillis Santa, MD Date: 08/13/2020; Time: 2:49 PM

## 2020-08-28 ENCOUNTER — Ambulatory Visit
Admission: RE | Admit: 2020-08-28 | Discharge: 2020-08-28 | Disposition: A | Discharge status: Home / Self Care | Payer: Commercial Managed Care - PPO | Source: Ambulatory Visit | Attending: Student in an Organized Health Care Education/Training Program | Admitting: Student in an Organized Health Care Education/Training Program

## 2020-08-28 ENCOUNTER — Other Ambulatory Visit: Payer: Self-pay

## 2020-08-28 DIAGNOSIS — G894 Chronic pain syndrome: Secondary | ICD-10-CM | POA: Diagnosis present

## 2020-08-28 DIAGNOSIS — G8929 Other chronic pain: Secondary | ICD-10-CM | POA: Diagnosis present

## 2020-08-28 DIAGNOSIS — M5441 Lumbago with sciatica, right side: Secondary | ICD-10-CM | POA: Insufficient documentation

## 2020-08-28 DIAGNOSIS — M5442 Lumbago with sciatica, left side: Secondary | ICD-10-CM | POA: Insufficient documentation

## 2020-08-28 DIAGNOSIS — M47816 Spondylosis without myelopathy or radiculopathy, lumbar region: Secondary | ICD-10-CM | POA: Insufficient documentation

## 2020-08-28 DIAGNOSIS — M5136 Other intervertebral disc degeneration, lumbar region: Secondary | ICD-10-CM | POA: Diagnosis present

## 2020-08-28 IMAGING — MR MR LUMBAR SPINE W/O CM
5 series · 30 of 48 positions shown · non-contrast
Comparison: [DATE] report.

CLINICAL DATA: Osteoarthritis, lumbosacral Low back pain, > 6 wks
Lumbar radiculopathy, > 6 wks, left hip pain.

EXAM:
MRI LUMBAR SPINE WITHOUT CONTRAST
TECHNIQUE: Multiplanar, multisequence MR imaging of the lumbar spine was
performed. No intravenous contrast was administered.

[Series 5: T2 · sagittal · 4.0mm · 0.81mm/px · 6 of 17 slices shown (1 of 2)]
[im 1/17]
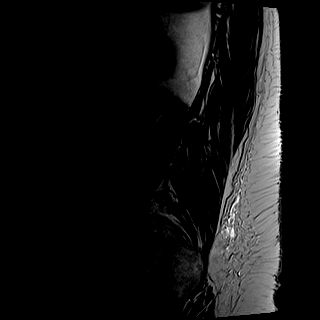
[im 4/17]
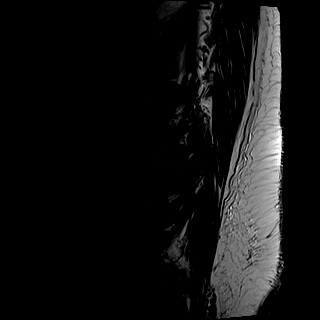
[im 7/17]
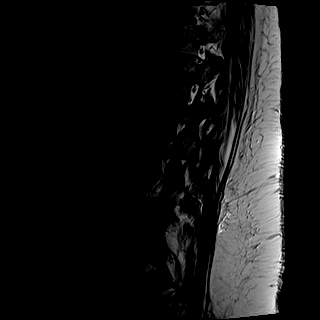
[im 10/17]
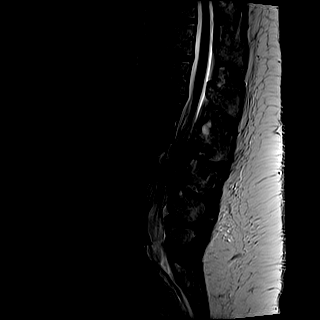
[im 13/17]
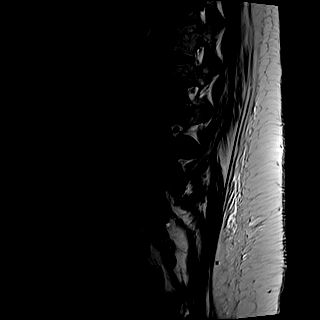
[im 17/17]
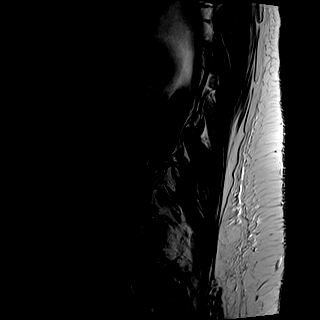

[Series 6: T1 · sagittal · 4.0mm · 0.81mm/px · 7 of 17 slices shown (1 of 2)]
[im 1/17]
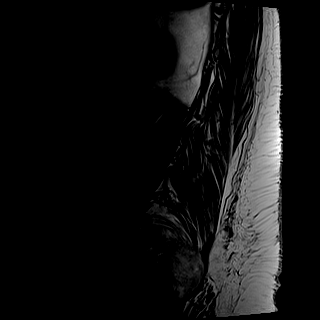
[im 3/17]
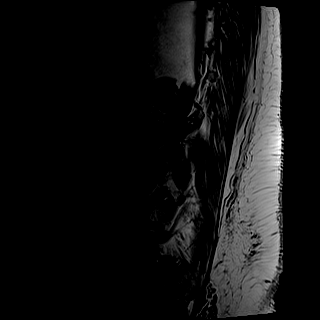
[im 6/17]
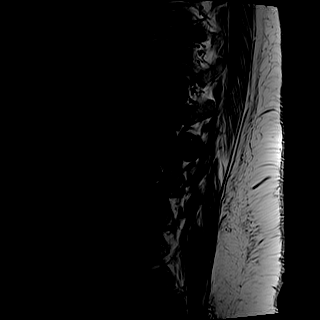
[im 9/17]
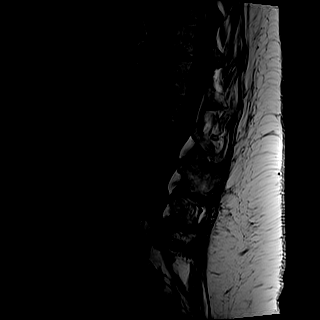
[im 11/17]
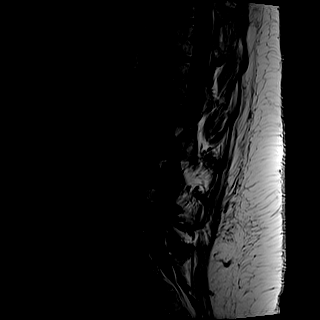
[im 14/17]
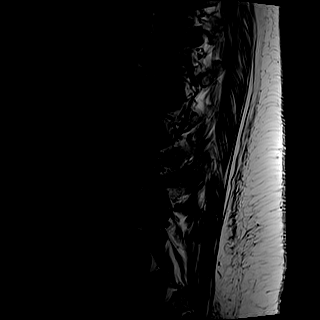
[im 17/17]
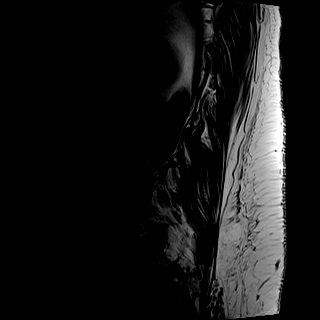

[Series 7: STIR · sagittal · 4.0mm · 0.41mm/px · 1 of 17 slices shown]
[im 1/17]
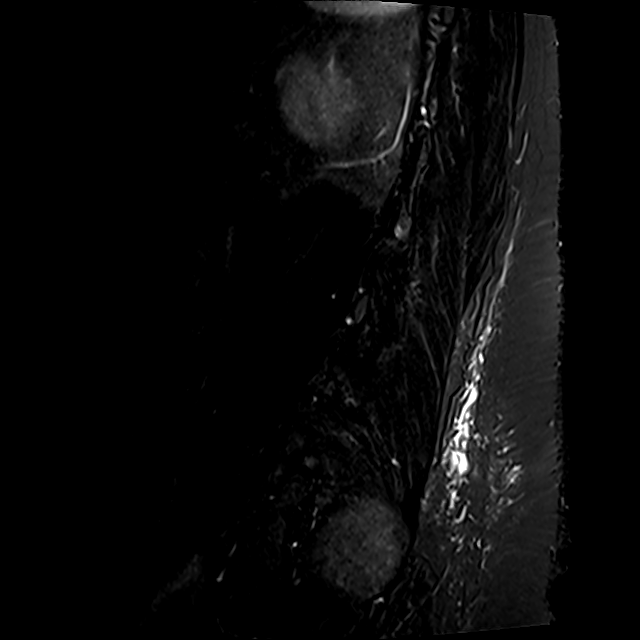

[Series 8: T2 · axial · 4.0mm · 0.78mm/px · z∈[-37,+148]mm · 8 of 34 slices shown (2 of 2)]
[im 1/34]
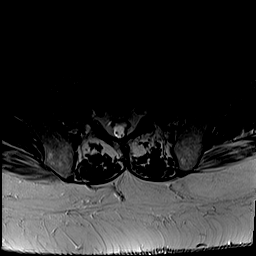
[im 6/34]
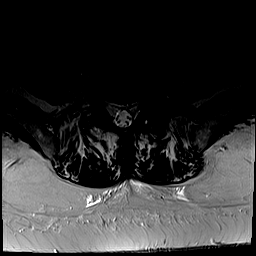
[im 11/34]
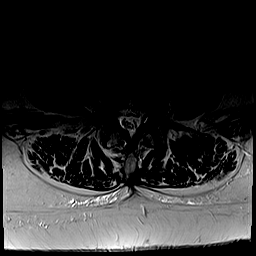
[im 16/34]
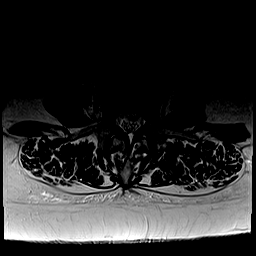
[im 18/34]
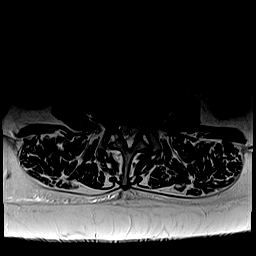
[im 23/34]
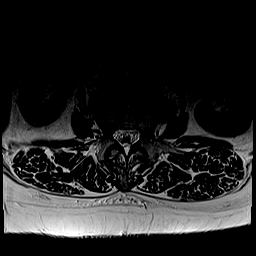
[im 28/34]
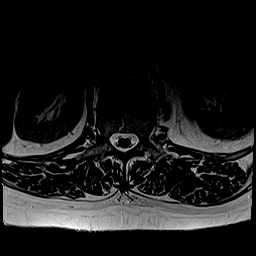
[im 34/34]
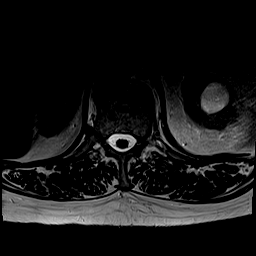

[Series 9: T1 · axial · 4.0mm · 0.39mm/px · z∈[-37,+148]mm · 8 of 34 slices shown (2 of 2)]
[im 1/34]
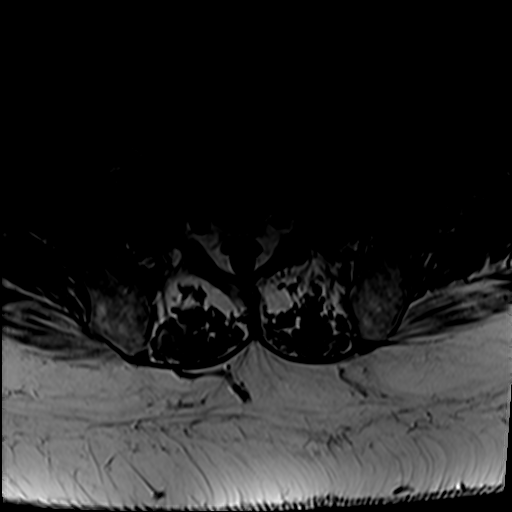
[im 6/34]
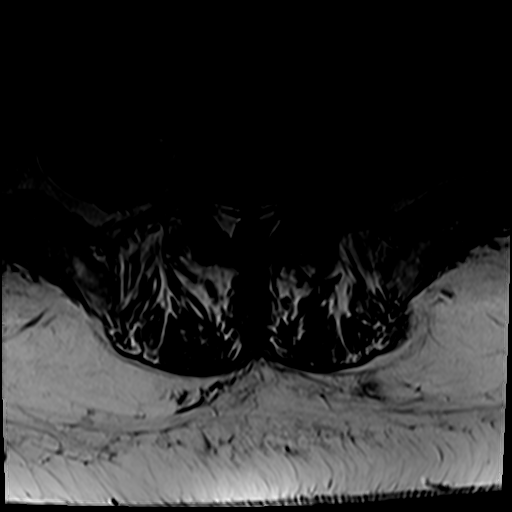
[im 11/34]
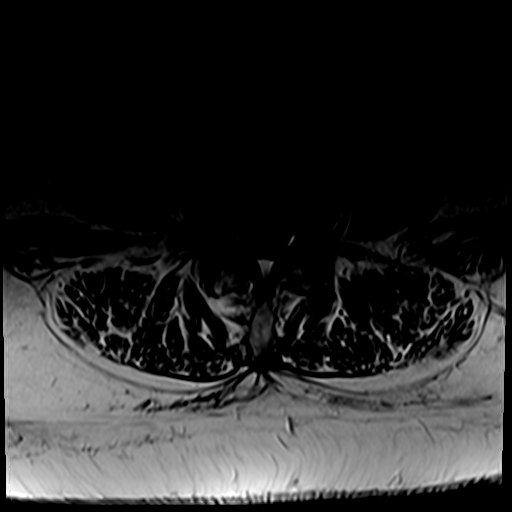
[im 16/34]
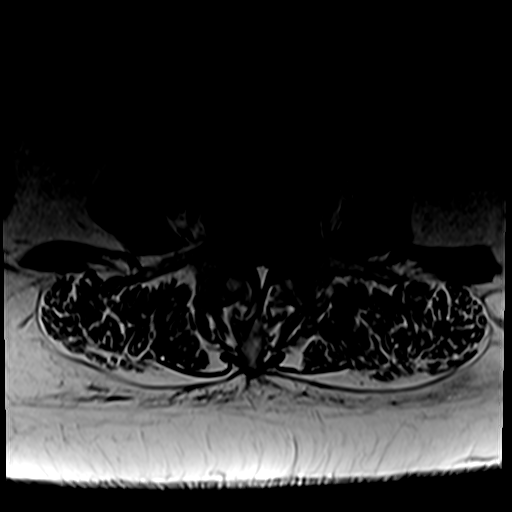
[im 18/34]
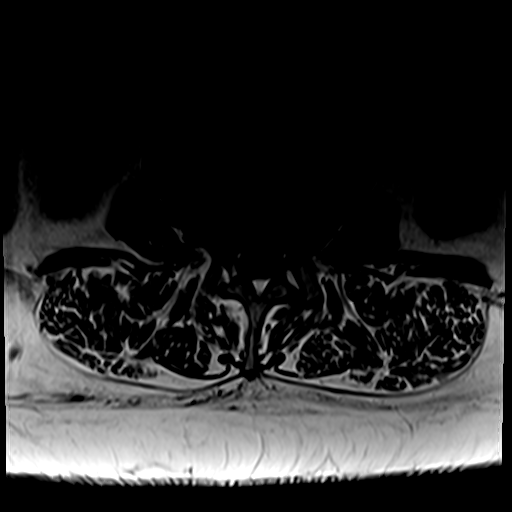
[im 23/34]
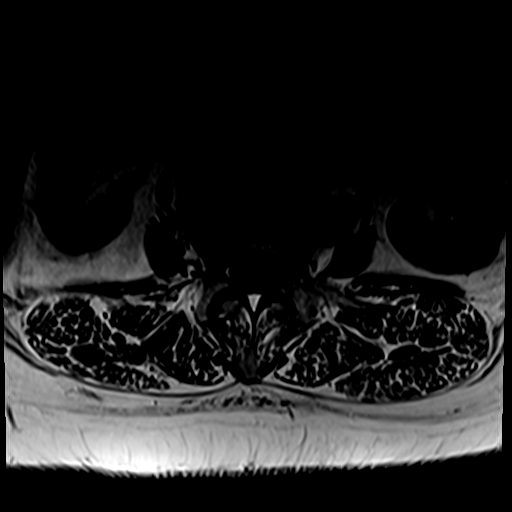
[im 28/34]
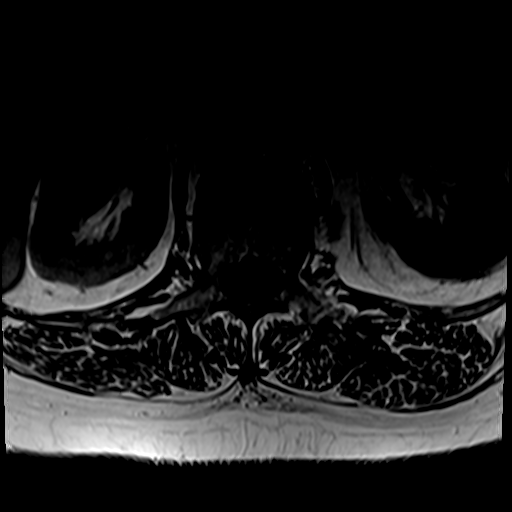
[im 34/34]
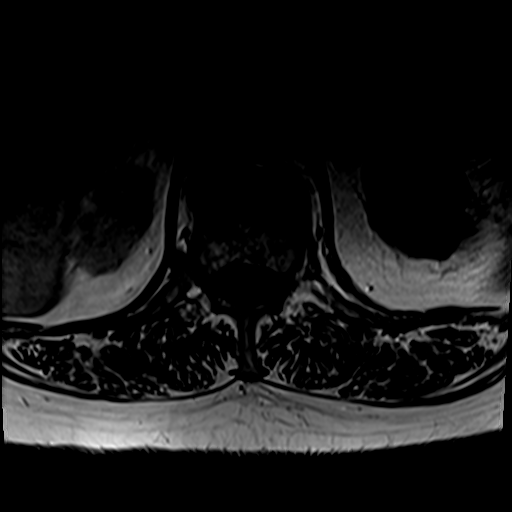

[30 of 48 positions shown; findings below may reference images not displayed]

FINDINGS: Segmentation:  Standard.

Alignment: Grade 1 stepwise retrolisthesis at the L2-S1 levels.
Congenital spinal canal narrowing.

Vertebrae: Modic type 2 endplate degenerative changes. No fracture
or aggressive osseous lesion.

Conus medullaris and cauda equina: Conus extends to the L2 level.
Conus and cauda equina appear normal.

Disc levels: Multilevel desiccation, disc space loss and Schmorl's
node formation.

L1-2: Minimal disc bulge and bilateral facet degenerative spurring.
Patent spinal canal and neural foramen.

L2-3: Mild disc bulge with superimposed bilateral foraminal
protrusions. Small left lateral protrusion. Bilateral facet
degenerative spurring. Mild spinal canal and moderate bilateral
neural foraminal narrowing.

L3-4: Disc bulge with superimposed central and left foraminal
protrusions. Bilateral facet hypertrophy and prominent ligamentum
flavum. Prominent dorsal epidural fat. Mild spinal canal, mild left
and severe right neural foraminal narrowing.

L4-5: Disc bulge with shallow central protrusion. Bilateral facet
hypertrophy and ligamentum flavum thickening. Small right foraminal
protrusion. Mild spinal canal, severe left and moderate right neural
foraminal narrowing.

L5-S1: Disc bulge abutting the exiting bilateral L5 nerve roots.
Superimposed central protrusion abutting the right greater than left
descending S1 nerve roots. Bilateral facet hypertrophy. Mild spinal
canal, mild right and moderate left neural foraminal narrowing.

Paraspinal and other soft tissues: Left renal cysts.
IMPRESSION: Mild spinal canal narrowing at the L2-S1 levels.

Moderate bilateral L2-3, right L4-5 and left L5-S1 neural foraminal
narrowing.

Severe right L3-4 and left L4-5 neural foraminal narrowing.

L5-S1 disc bulge/protrusion abutment of the exiting bilateral L5 and
descending bilateral S1 nerve roots.

## 2020-09-10 ENCOUNTER — Telehealth: Payer: Self-pay | Admitting: Student in an Organized Health Care Education/Training Program

## 2020-09-10 DIAGNOSIS — M5416 Radiculopathy, lumbar region: Secondary | ICD-10-CM

## 2020-09-10 DIAGNOSIS — M48062 Spinal stenosis, lumbar region with neurogenic claudication: Secondary | ICD-10-CM

## 2020-09-10 DIAGNOSIS — G8929 Other chronic pain: Secondary | ICD-10-CM

## 2020-09-10 DIAGNOSIS — G894 Chronic pain syndrome: Secondary | ICD-10-CM

## 2020-09-10 NOTE — Telephone Encounter (Signed)
Patient is calling to get Results from MRI? Dr. Cherylann Ratel put in his appt discharge that he would call him with results. Results are in the chart, if Dr. Cherylann Ratel can call with results please.

## 2020-09-10 NOTE — Telephone Encounter (Signed)
Will you call patient with MRI results

## 2020-09-11 DIAGNOSIS — M48062 Spinal stenosis, lumbar region with neurogenic claudication: Secondary | ICD-10-CM | POA: Insufficient documentation

## 2020-09-11 NOTE — Telephone Encounter (Signed)
Called patient to discuss his lumbar MRI results.  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  This is present on the right at L3 as well as L4 and L5 nerve roots.  Discussed a series of lumbar epidural steroid injections.  Risks and benefits reviewed and patient would like to proceed.  Order placed.

## 2020-10-27 NOTE — Progress Notes (Signed)
Virtual Visit via Video Note  I connected with [name] on 10/30/20 at 10:00 AM EDT by a video enabled telemedicine application and verified that I am speaking with the correct person using two identifiers.  Location: Patient: home Provider: office Persons participated in the visit- patient, provider    I discussed the limitations of evaluation and management by telemedicine and the availability of in person appointments. The patient expressed understanding and agreed to proceed.   I discussed the assessment and treatment plan with the patient. The patient was provided an opportunity to ask questions and all were answered. The patient agreed with the plan and demonstrated an understanding of the instructions.   The patient was advised to call back or seek an in-person evaluation if the symptoms worsen or if the condition fails to improve as anticipated.  I provided 40 minutes of non-face-to-face time during this encounter.   [name], MD     Psychiatric Initial Adult Assessment   Patient Identification: Keith Rogers MRN:  [id number] Date of Evaluation:  10/30/2020 Referral Source: Olena Leatherwood, FNP  Chief Complaint:  "I'm scared to death to go back to work" Visit Diagnosis:    ICD-10-CM   1. Current moderate episode of major depressive disorder without prior episode (HCC)  F32.1       History of Present Illness:   Keith Rogers is a 60 y.o. year old male with a history of depression, anxiety, hypertension, chronic pain with cervical radicular pain s/p midline C7-T1 ESI, osteorthritis, DDD with Lumbar spondylosis, who is referred for depression.   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  There was a transfer 14 months ago, and he suffered from appendicitis in May last year.  He believes that he is not fully recovered, and completely collapsed since then.  He states that he used to be taking caffeine pills, ibuprofen before this  appendicitis.  He notices that he has had worsening pain without this medication.  He complains of left hip, neck and back pain.  He would not be able to move more than 15 minutes due to pain, and at the same time he needs to constantly move as he is not able to sit still due to his hip pain.  Although he is constantly concerned about bills now that he is on 60% income while on LTD, he feels scared to death to go back to work.  He does not think he can work due to his pain.  He also feels down as he is unable to do things, although he used to enjoy yard work.  He always feels that something is going wrong, or is concerned now that he is out of work.   He has depressive symptoms as in PHQ-9 for the past several months.  He denies SI.  He has occasional panic attacks.   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, [name], power equipment Support: Household: significant other Marital status: has significant others of more than 15 years, divorced once Number of children:  0     Wt Readings from Last 3 Encounters:  07/17/20 (!) 334 lb (151.5 kg)  07/01/20 (!) 334 lb (151.5 kg)  04/26/20 (!) 312 lb (141.5 kg)     Associated Signs/Symptoms: Depression Symptoms:  depressed mood, insomnia, fatigue, difficulty concentrating, anxiety, panic attacks, (Hypo) Manic Symptoms:   denies decreased need for sleep, euphoria Anxiety Symptoms:  Excessive Worry, Panic Symptoms, Psychotic Symptoms:   denies AH, VH, paranoia PTSD Symptoms: Negative  Past Psychiatric History:  Outpatient: anxiety for many years Psychiatry  admission: denies Previous suicide attempt: denies Past trials of medication: lexapro, gabapentin,  History of violence:    Previous Psychotropic Medications: Yes   Substance Abuse History in the last 12 months:  No.  Consequences of Substance Abuse: NA  Past Medical History:  Past Medical History:  Diagnosis Date   Arthritis    Asthma    Cervical radicular pain    Depression    Hypertension    Obesity     Past Surgical History:  Procedure Laterality Date   APPENDECTOMY     CARPAL TUNNEL RELEASE Right 07/01/2020   Procedure: CARPAL TUNNEL RELEASE;  Surgeon: Donato Heinz, MD;  Location: [facility];  Service: Orthopedics;  Laterality: Right;   EPIDURAL BLOCK INJECTION     cervical.  is seen in [facility] clinic at [facility]    Family Psychiatric History:  denies  Family History:  Family History  Problem Relation Age of Onset   Healthy Mother    Cancer Father        agent orange    Social History:   Social History   Socioeconomic History   Marital status: Single    Spouse name: Not on file   Number of children: Not on file   Years of education: Not on file   Highest education level: Not on file  Occupational History    Comment: has been out of work since May  Tobacco Use   Smoking status: Former    Packs/day: 1.00    Pack years: 0.00    Types: Cigarettes    Quit date: 09/06/2019    Years since quitting: 1.1   Smokeless tobacco: Never  Vaping Use   Vaping Use: Never used  Substance and Sexual Activity   Alcohol use: Yes    Comment: occassionally   Drug use: Never   Sexual activity: Not on file  Other Topics Concern   Not on file  Social History Narrative   Patient lives with significant other.   He feels safe in his home.   Has been out of work since May due to his pain/medical issues   Social Determinants of Corporate investment banker Strain: Not on file  Food Insecurity: Not on file  Transportation Needs: Not on file  Physical Activity: Not on file   Stress: Not on file  Social Connections: Not on file    Additional Social History: as above  Allergies:  No Known Allergies  Metabolic Disorder Labs: No results found for: HGBA1C, MPG No results found for: PROLACTIN No results found for: CHOL, TRIG, HDL, CHOLHDL, VLDL, LDLCALC No results found for: TSH  Therapeutic Level Labs: No results found for: LITHIUM No results found for: CBMZ No results found for: VALPROATE  Current Medications: Current Outpatient Medications  Medication Sig Dispense Refill   albuterol (PROVENTIL HFA;VENTOLIN HFA) 108 (90 Base) MCG/ACT inhaler Inhale 1-2 puffs into the lungs every 6 (six) hours as needed for wheezing or shortness of breath.  1 Inhaler 0   diclofenac (VOLTAREN) 50 MG EC tablet Take 50 mg by mouth 2 (two) times daily.     gabapentin (NEURONTIN) 300 MG capsule Take 300 mg by mouth 3 (three) times daily.     hydrochlorothiazide (HYDRODIURIL) 12.5 MG tablet Take 12.5 mg by mouth in the morning and at bedtime.     Multiple Vitamins-Minerals (MULTIVITAMIN WITH MINERALS) tablet Take 1 tablet by mouth daily.     propranolol (INDERAL) 20 MG tablet Take 20 mg by mouth 2 (two) times daily.     Spacer/Aero-Holding Chambers (AEROCHAMBER PLUS) inhaler Use as instructed 1 each 2   traMADol-acetaminophen (ULTRACET) 37.5-325 MG tablet Take 1-2 tablets by mouth every 6 (six) hours as needed for severe pain.     No current facility-administered medications for this visit.    Musculoskeletal: Strength & Muscle Tone:  N/A Gait & Station:  N/A Patient leans: N/A  Psychiatric Specialty Exam: Review of Systems  Psychiatric/Behavioral:  Positive for decreased concentration, dysphoric mood and sleep disturbance. Negative for agitation, behavioral problems, confusion, hallucinations, self-injury and suicidal ideas. The patient is nervous/anxious. The patient is not hyperactive.   All other systems reviewed and are negative.  There were no vitals taken for this  visit.There is no height or weight on file to calculate BMI.  General Appearance: Fairly Groomed  Eye Contact:  Good  Speech:  Clear and Coherent  Volume:  Normal  Mood:  Anxious and Depressed  Affect:  Appropriate, Congruent, Restricted, and calm  Thought Process:  Coherent  Orientation:  Full (Time, Place, and Person)  Thought Content:  Logical  Suicidal Thoughts:  No  Homicidal Thoughts:  No  Memory:  Immediate;   Good  Judgement:  Good  Insight:  Good  Psychomotor Activity:  Normal  Concentration:  Concentration: Good and Attention Span: Good  Recall:  Good  Fund of Knowledge:Good  Language: Good  Akathisia:  No  Handed:  Right  AIMS (if indicated):  not done  Assets:  Communication Skills Desire for Improvement  ADL's:  Intact  Cognition: WNL  Sleep:  Poor   Screenings: PHQ2-9    Flowsheet Row [facility]  PHQ-2 Total Score 3  PHQ-9 Total Score 14      Flowsheet Row Admission (Discharged) [facility]/2020 [facility]  C-SSRS RISK CATEGORY No Risk       Assessment and Plan:  Mauro KaufmannRalph Hinojosa is a 60 y.o. year old male with a history of depression, anxiety, hypertension, chronic pain with cervical radicular pain s/p midline C7-T1 ESI, osteorthritis, DDD with Lumbar spondylosis, who is referred for depression.   1. Current moderate episode of major depressive disorder without prior episode (HCC) He reports depressive symptoms with anxiety for the past several months and relate to his pain/demoralization.  Other psychosocial stressors includes financial strain while he is on LTD.  Will switch from Lexapro to duloxetine given this medication can be beneficial for his pain in addition to his mood symptoms.  He will greatly benefit from CBT; will make referral.   # Insomnia He reports occasional middle insomnia, daytime fatigue, and snoring.  Will make referral for sleep  evaluation.   Plan Discontinue lexapro  Start duloxetine 30 mg daily  Discontinue lexapro Referral for sleep evaluation Referral for therapy  Next appointment: 8/10 at 8 AM for 30 mins, video - gabapentin 300 mg three times a day, propranolol  Medication- lexapro 10 mg  daily, gabapentin 300 mg three times a day, propranolol 20 mg twice a day for hypertension, anxiety  The patient demonstrates the following risk factors for suicide: Chronic risk factors for suicide include: psychiatric disorder of depression, anxiety and chronic pain. Acute risk factors for suicide include: loss (financial, interpersonal, professional). Protective factors for this patient include: positive social support, coping skills, and hope for the future. Considering these factors, the overall suicide risk at this point appears to be low. Patient is appropriate for outpatient follow up.    [name], MD 7/13/202210:52 AM

## 2020-10-30 ENCOUNTER — Other Ambulatory Visit: Payer: Self-pay

## 2020-10-30 ENCOUNTER — Encounter: Payer: Self-pay | Admitting: Psychiatry

## 2020-10-30 ENCOUNTER — Telehealth (INDEPENDENT_AMBULATORY_CARE_PROVIDER_SITE_OTHER): Payer: Commercial Managed Care - PPO | Admitting: Psychiatry

## 2020-10-30 DIAGNOSIS — F321 Major depressive disorder, single episode, moderate: Secondary | ICD-10-CM | POA: Diagnosis not present

## 2020-11-20 NOTE — Progress Notes (Signed)
Virtual Visit via Video Note  I connected with Keith Rogers on 11/27/20 at  8:30 AM EDT by a video enabled telemedicine application and verified that I am speaking with the correct person using two identifiers.  Location: Patient: home Provider: office Persons participated in the visit- patient, provider    I discussed the limitations of evaluation and management by telemedicine and the availability of in person appointments. The patient expressed understanding and agreed to proceed.    I discussed the assessment and treatment plan with the patient. The patient was provided an opportunity to ask questions and all were answered. The patient agreed with the plan and demonstrated an understanding of the instructions.   The patient was advised to call back or seek an in-person evaluation if the symptoms worsen or if the condition fails to improve as anticipated.  I provided 15 minutes of non-face-to-face time during this encounter.   [name], MD     [facility] MD/PA/NP OP Progress Note  11/27/2020 8:58 AM Keith Rogers  MRN:  [id number]  Chief Complaint:  Chief Complaint   Follow-up; Depression    HPI:  This is a follow-up appointment for depression and anxiety.  He states that his mother was found to have melanoma in her her lung.  She will have chest tube, and will have drainage.  He has not been able to visit her as he got COVID the second time.  He feels weak and fatigued, although it has been getting better.  He also states that his wife's family has some financial issues, and they may need to sell the house on October 25.  Although they are trying to stop this, he may need to move out from the house.  He feels depressed and sad around the situation.  It has been difficult for him to keep others happy.  He agrees to have self compassion for himself.  He is willing to see a therapist.  He has not been able to start duloxetine and has been on Lexapro.  He is willing to make this change.   He has insomnia.  He denies change in appetite or weight.  He denies SI.  He feels anxious sometimes at times.   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 I have reviewed the history. No updates at this time.     Past Medical History:  Past Medical History:  Diagnosis Date   Arthritis    Asthma    Cervical radicular pain    Depression    Hypertension    Obesity     Past Surgical History:  Procedure Laterality Date   APPENDECTOMY     CARPAL TUNNEL RELEASE Right 07/01/2020   Procedure: CARPAL TUNNEL RELEASE;  Surgeon: Donato Heinz, MD;  Location: [facility] OR[facility];  Service: Orthopedics;  Laterality: Right;   EPIDURAL BLOCK INJECTION     cervical.  is seen in pain clinic at [facility]    Family Psychiatric History: Please see initial evaluation for full details. I have reviewed the history. No updates at this  time.     Family History:  Family History  Problem Relation Age of Onset   Healthy Mother    Cancer Father        agent orange    Social History:  Social History   Socioeconomic History   Marital status: Single    Spouse name: Not on file   Number of children: Not on file   Years of education: Not on file   Highest education level: Not on file  Occupational History    Comment: has been out of work since May  Tobacco Use   Smoking status: Former    Packs/day: 1.00    Types: Cigarettes    Quit date: 09/06/2019    Years since quitting: 1.2   Smokeless tobacco: Never  Vaping Use   Vaping Use: Never used  Substance and  Sexual Activity   Alcohol use: Yes    Comment: occassionally   Drug use: Never   Sexual activity: Not on file  Other Topics Concern   Not on file  Social History Narrative   Patient lives with significant other.   He feels safe in his home.   Has been out of work since May due to his pain/medical issues   Social Determinants of Corporate investment banker Strain: Not on file  Food Insecurity: Not on file  Transportation Needs: Not on file  Physical Activity: Not on file  Stress: Not on file  Social Connections: Not on file    Allergies: No Known Allergies  Metabolic Disorder Labs: No results found for: HGBA1C, MPG No results found for: PROLACTIN No results found for: CHOL, TRIG, HDL, CHOLHDL, VLDL, LDLCALC No results found for: TSH  Therapeutic Level Labs: No results found for: LITHIUM No results found for: VALPROATE No components found for:  CBMZ  Current Medications: Current Outpatient Medications  Medication Sig Dispense Refill   DULoxetine (CYMBALTA) 30 MG capsule Take 1 capsule (30 mg total) by mouth daily. 30 capsule 1   albuterol (PROVENTIL HFA;VENTOLIN HFA) 108 (90 Base) MCG/ACT inhaler Inhale 1-2 puffs into the lungs every 6 (six) hours as needed for wheezing or shortness of breath. 1 Inhaler 0   diclofenac (VOLTAREN) 50 MG EC tablet Take 50 mg by mouth 2 (two) times daily.     gabapentin (NEURONTIN) 300 MG capsule Take 300 mg by mouth 3 (three) times daily.     hydrochlorothiazide (HYDRODIURIL) 12.5 MG tablet Take 12.5 mg by mouth in the morning and at bedtime.     Multiple Vitamins-Minerals (MULTIVITAMIN WITH MINERALS) tablet Take 1 tablet by mouth daily.     propranolol (INDERAL) 20 MG tablet Take 20 mg by mouth 2 (two) times daily.     Spacer/Aero-Holding Chambers (AEROCHAMBER PLUS) inhaler Use as instructed 1 each 2   traMADol-acetaminophen (ULTRACET) 37.5-325 MG tablet Take 1-2 tablets by mouth every 6 (six) hours as needed for severe pain.     No  current facility-administered medications for this visit.     Musculoskeletal: Strength & Muscle Tone:  N/A Gait & Station:  N/A Patient leans: N/A  Psychiatric Specialty Exam: Review of Systems  Psychiatric/Behavioral:  Positive for dysphoric mood and sleep disturbance. Negative for agitation, behavioral problems, confusion, decreased concentration, hallucinations, self-injury and suicidal ideas. The patient is nervous/anxious. The patient is not hyperactive.   All other systems reviewed and are negative.  There were no vitals taken for this visit.There is no height or weight on file to calculate BMI.  General Appearance:  Fairly Groomed  Eye Contact:  Good  Speech:  Clear and Coherent  Volume:  Normal  Mood:  Depressed  Affect:  Appropriate, Congruent, and down  Thought Process:  Coherent  Orientation:  Full (Time, Place, and Person)  Thought Content: Logical   Suicidal Thoughts:  No  Homicidal Thoughts:  No  Memory:  Immediate;   Good  Judgement:  Good  Insight:  Good  Psychomotor Activity:  Normal  Concentration:  Concentration: Good and Attention Span: Good  Recall:  Good  Fund of Knowledge: Good  Language: Good  Akathisia:  No  Handed:  Right  AIMS (if indicated): not done  Assets:  Communication Skills Desire for Improvement  ADL's:  Intact  Cognition: WNL  Sleep:  Poor   Screenings: PHQ2-9    Flowsheet Row Video Visit from 10/30/2020 in [facility]  PHQ-2 Total Score 3  PHQ-9 Total Score 14      Flowsheet Row Video Visit from 11/27/2020 in [facility] Admission (Discharged) from 07/01/2020 in [facility]  C-SSRS RISK CATEGORY No Risk No Risk        Assessment and Plan:  Raequan Vanschaick is a 60 y.o. year old male with a history of  depression, anxiety, hypertension, chronic pain with cervical radicular pain s/p midline C7-T1 ESI, osteoarthritis, DDD with Lumbar  spondylosis, who presents for follow up appointment for below.   1. Current moderate episode of major depressive disorder without prior episode (HCC) He reports slight worsening in depressive symptoms in the context of suffering from COVID the second time, his mother being found out melanoma in her lung, and potential upcoming relocation due to financial issues.  Other psychosocial stressors includes financial strain while he is on LTD.  He was unable to get Lexapro at the pharmacy due to issues with the older.  Will switch from Lexapro to duloxetine to target depression with the hope that it would also help his pain.  He will greatly benefit from CBT; will make a referral.    # Insomnia He reports occasional middle insomnia, daytime fatigue.  He has no known snoring .  Will continue to monitor , and will make referral for sleep evaluation if clinically appropriate.   Plan Discontinue lexapro Start duloxetine 30 mg daily Referral for therapy Next appointment: 10/5 at 11:30 for 30 mins, video - gabapentin 300 mg three times a day, propranolol    The patient demonstrates the following risk factors for suicide: Chronic risk factors for suicide include: psychiatric disorder of depression, anxiety and chronic pain. Acute risk factors for suicide include: loss (financial, interpersonal, professional). Protective factors for this patient include: positive social support, coping skills, and hope for the future. Considering these factors, the overall suicide risk at this point appears to be low. Patient is appropriate for outpatient follow up.   [name], MD 11/27/2020, 8:58 AM

## 2020-11-27 ENCOUNTER — Telehealth (INDEPENDENT_AMBULATORY_CARE_PROVIDER_SITE_OTHER): Payer: Commercial Managed Care - PPO | Admitting: Psychiatry

## 2020-11-27 ENCOUNTER — Other Ambulatory Visit: Payer: Self-pay

## 2020-11-27 ENCOUNTER — Encounter: Payer: Self-pay | Admitting: Psychiatry

## 2020-11-27 DIAGNOSIS — F321 Major depressive disorder, single episode, moderate: Secondary | ICD-10-CM | POA: Diagnosis not present

## 2020-11-27 MED ORDER — DULOXETINE HCL 30 MG PO CPEP: 30.0000 mg | ORAL_CAPSULE | Freq: Every day | ORAL | 1 refills | Status: DC

## 2020-11-27 NOTE — Patient Instructions (Signed)
Discontinue lexapro Start duloxetine 30 mg daily Referral for therapy Next appointment: 10/5 at 11:30

## 2020-12-20 ENCOUNTER — Ambulatory Visit (INDEPENDENT_AMBULATORY_CARE_PROVIDER_SITE_OTHER): Payer: Commercial Managed Care - PPO | Admitting: Psychologist

## 2020-12-20 DIAGNOSIS — F411 Generalized anxiety disorder: Secondary | ICD-10-CM | POA: Diagnosis not present

## 2020-12-20 DIAGNOSIS — F321 Major depressive disorder, single episode, moderate: Secondary | ICD-10-CM

## 2021-01-02 ENCOUNTER — Ambulatory Visit (INDEPENDENT_AMBULATORY_CARE_PROVIDER_SITE_OTHER): Payer: Commercial Managed Care - PPO | Admitting: Psychologist

## 2021-01-02 DIAGNOSIS — F321 Major depressive disorder, single episode, moderate: Secondary | ICD-10-CM

## 2021-01-02 DIAGNOSIS — F411 Generalized anxiety disorder: Secondary | ICD-10-CM | POA: Diagnosis not present

## 2021-01-15 ENCOUNTER — Ambulatory Visit (INDEPENDENT_AMBULATORY_CARE_PROVIDER_SITE_OTHER): Payer: Commercial Managed Care - PPO | Admitting: Psychologist

## 2021-01-15 DIAGNOSIS — F411 Generalized anxiety disorder: Secondary | ICD-10-CM | POA: Diagnosis not present

## 2021-01-15 DIAGNOSIS — F321 Major depressive disorder, single episode, moderate: Secondary | ICD-10-CM | POA: Diagnosis not present

## 2021-01-21 NOTE — Progress Notes (Signed)
Virtual Visit via Video Note  I connected with Keith Rogers on 01/22/21 at 11:30 AM EDT by a video enabled telemedicine application and verified that I am speaking with the correct person using two identifiers.  Location: Patient: home Provider: office Persons participated in the visit- patient, provider    I discussed the limitations of evaluation and management by telemedicine and the availability of in person appointments. The patient expressed understanding and agreed to proceed.    I discussed the assessment and treatment plan with the patient. The patient was provided an opportunity to ask questions and all were answered. The patient agreed with the plan and demonstrated an understanding of the instructions.   The patient was advised to call back or seek an in-person evaluation if the symptoms worsen or if the condition fails to improve as anticipated.  I provided 22 minutes of non-face-to-face time during this encounter.   [name], MD    [facility] MD/PA/NP OP Progress Note  01/22/2021 12:07 PM Keith Rogers  MRN:  [id number]  Chief Complaint:  Chief Complaint   Follow-up; Depression    HPI:  This is a follow-up appointment for depression.  He states that he found out that he needs to move out from the house.  He feels upset about the situation.  He is planning to live at his wife's friend's house.  He has been helping his mother who has cancer.  He also talks about his sister, who has alcohol use disorder.  Although she is about to lose the place, he is concerned if she were to live together.  He states that he has so much on his plate.  He has been trying to prioritize things, stating that mom comes first.  He finds therapy to be helpful to a certain extent.  He continues to have insomnia.  He feels fatigue.  He agrees to discuss with his PCP about propranolol, which can be attributable to fatigue.  He has occasional binge eating when he feels stressed.  He tends to eat snacks  and potato chips. He enjoys mowing yard, and denies anhedonia.  He denies SI.  He has not noticed much difference since switching from Lexapro to duloxetine.  He is willing to try higher dose of duloxetine.  He checks his BP every day at home.  He agrees to continue monitoring it.   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 I have reviewed the history. No updates at this time.     Past Medical History:  Past Medical History:  Diagnosis Date   Arthritis    Asthma    Cervical radicular pain    Depression    Hypertension    Obesity     Past Surgical History:  Procedure Laterality Date   APPENDECTOMY     CARPAL TUNNEL RELEASE Right 07/01/2020   Procedure: CARPAL TUNNEL RELEASE;  Surgeon: Donato Heinz, MD;  Location: [facility] OR[facility];  Service: Orthopedics;  Laterality: Right;   EPIDURAL BLOCK INJECTION     cervical.  is seen in  pain clinic at [facility]    Family Psychiatric History: Please see initial evaluation for full details. I have reviewed the history. No updates at this time.     Family History:  Family History  Problem Relation Age of Onset   Healthy Mother    Cancer Father        agent orange    Social History:  Social History   Socioeconomic History   Marital status: Single    Spouse name: Not on file   Number of children: Not on file   Years of education: Not on file   Highest education level: Not on file  Occupational History    Comment: has been out of work since May  Tobacco Use   Smoking status: Former     Packs/day: 1.00    Types: Cigarettes    Quit date: 09/06/2019    Years since quitting: 1.3   Smokeless tobacco: Never  Vaping Use   Vaping Use: Never used  Substance and Sexual Activity   Alcohol use: Yes    Comment: occassionally   Drug use: Never   Sexual activity: Not on file  Other Topics Concern   Not on file  Social History Narrative   Patient lives with significant other.   He feels safe in his home.   Has been out of work since May due to his pain/medical issues   Social Determinants of Corporate investment banker Strain: Not on file  Food Insecurity: Not on file  Transportation Needs: Not on file  Physical Activity: Not on file  Stress: Not on file  Social Connections: Not on file    Allergies: No Known Allergies  Metabolic Disorder Labs: No results found for: HGBA1C, MPG No results found for: PROLACTIN No results found for: CHOL, TRIG, HDL, CHOLHDL, VLDL, LDLCALC No results found for: TSH  Therapeutic Level Labs: No results found for: LITHIUM No results found for: VALPROATE No components found for:  CBMZ  Current Medications: Current Outpatient Medications  Medication Sig Dispense Refill   albuterol (PROVENTIL HFA;VENTOLIN HFA) 108 (90 Base) MCG/ACT inhaler Inhale 1-2 puffs into the lungs every 6 (six) hours as needed for wheezing or shortness of breath. 1 Inhaler 0   diclofenac (VOLTAREN) 50 MG EC tablet Take 50 mg by mouth 2 (two) times daily.     [START ON 01/23/2021] DULoxetine (CYMBALTA) 60 MG capsule Take 1 capsule (60 mg total) by mouth daily. 30 capsule 1   gabapentin (NEURONTIN) 300 MG capsule Take 300 mg by mouth 3 (three) times daily.     hydrochlorothiazide (HYDRODIURIL) 12.5 MG tablet Take 12.5 mg by mouth in the morning and at bedtime.     Multiple Vitamins-Minerals (MULTIVITAMIN WITH MINERALS) tablet Take 1 tablet by mouth daily.     propranolol (INDERAL) 20 MG tablet Take 20 mg by mouth 2 (two) times daily.     Spacer/Aero-Holding Chambers  (AEROCHAMBER PLUS) inhaler Use as instructed 1 each 2   traMADol-acetaminophen (ULTRACET) 37.5-325 MG tablet Take 1-2 tablets by mouth every 6 (six) hours as needed for severe pain.     No current facility-administered medications for this visit.     Musculoskeletal: Strength & Muscle Tone:  N/A Gait & Station:  N/A Patient leans: N/A  Psychiatric Specialty Exam: Review of Systems  Psychiatric/Behavioral:  Positive for dysphoric mood and sleep disturbance. Negative for agitation, behavioral problems, confusion, decreased concentration, hallucinations, self-injury and suicidal ideas. The patient is nervous/anxious. The patient is not hyperactive.  All other systems reviewed and are negative.  There were no vitals taken for this visit.There is no height or weight on file to calculate BMI.  General Appearance: Fairly Groomed  Eye Contact:  Good  Speech:  Clear and Coherent  Volume:  Normal  Mood:  Depressed  Affect:  Appropriate, Congruent, and calm  Thought Process:  Coherent  Orientation:  Full (Time, Place, and Person)  Thought Content: Logical   Suicidal Thoughts:  No  Homicidal Thoughts:  No  Memory:  Immediate;   Good  Judgement:  Good  Insight:  Good  Psychomotor Activity:  Normal  Concentration:  Concentration: Good and Attention Span: Good  Recall:  Good  Fund of Knowledge: Good  Language: Good  Akathisia:  No  Handed:  Right  AIMS (if indicated): not done  Assets:  Communication Skills Desire for Improvement  ADL's:  Intact  Cognition: WNL  Sleep:  Poor   Screenings: PHQ2-9    Flowsheet Row Video Visit from 10/30/2020 in [facility]  PHQ-2 Total Score 3  PHQ-9 Total Score 14      Flowsheet Row Video Visit from 01/22/2021 in [facility] Video Visit from 11/27/2020 in [facility] Admission (Discharged) from 07/01/2020 in [facility]   C-SSRS RISK CATEGORY No Risk No Risk No Risk        Assessment and Plan:  Keith Rogers is a 60 y.o. year old male with a history of depression, anxiety, hypertension, chronic pain with cervical radicular pain s/p midline C7-T1 ESI, osteoarthritis, DDD with Lumbar spondylosis, who presents for follow up appointment for below.   1. Current moderate episode of major depressive disorder without prior episode [facility]) He continues to report depressive symptoms and an anxiety in the context of taking care of his mother with lung melanoma, relocation due to financial issues.  Other psychosocial stressors includes his sister who has alcohol use disorder, and financial strain while he is on LTD.  Will uptitrate duloxetine to optimize treatment for depression.  Discussed potential risk of hypertension.  He will continue to see a therapist.    # Insomnia Unchanged. He reports occasional middle insomnia, daytime fatigue.  His wife has not observed any snoring.  Will continue to monitor and make referral for sleep evaluation if needed in the future.   Plan Increase duloxetine 60 mg daily; he checks HBP daily Next appointment: 12/2 at 9:30 for 30 mins, video - gabapentin 300 mg three times a day, propranolol     The patient demonstrates the following risk factors for suicide: Chronic risk factors for suicide include: psychiatric disorder of depression, anxiety and chronic pain. Acute risk factors for suicide include: loss (financial, interpersonal, professional). Protective factors for this patient include: positive social support, coping skills, and hope for the future. Considering these factors, the overall suicide risk at this point appears to be low. Patient is appropriate for outpatient follow up.    [name], MD 01/22/2021, 12:07 PM

## 2021-01-22 ENCOUNTER — Telehealth (INDEPENDENT_AMBULATORY_CARE_PROVIDER_SITE_OTHER): Payer: Commercial Managed Care - PPO | Admitting: Psychiatry

## 2021-01-22 ENCOUNTER — Encounter: Payer: Self-pay | Admitting: Psychiatry

## 2021-01-22 ENCOUNTER — Other Ambulatory Visit: Payer: Self-pay

## 2021-01-22 DIAGNOSIS — F321 Major depressive disorder, single episode, moderate: Secondary | ICD-10-CM | POA: Diagnosis not present

## 2021-01-22 MED ORDER — DULOXETINE HCL 60 MG PO CPEP: 60.0000 mg | ORAL_CAPSULE | Freq: Every day | ORAL | 1 refills | Status: DC

## 2021-01-22 NOTE — Patient Instructions (Signed)
Increase duloxetine 60 mg daily Next appointment: 12/2 at 9:30

## 2021-01-31 ENCOUNTER — Ambulatory Visit: Payer: Commercial Managed Care - PPO | Admitting: Psychologist

## 2021-02-10 ENCOUNTER — Ambulatory Visit (INDEPENDENT_AMBULATORY_CARE_PROVIDER_SITE_OTHER): Payer: Commercial Managed Care - PPO | Admitting: Psychologist

## 2021-02-10 DIAGNOSIS — F321 Major depressive disorder, single episode, moderate: Secondary | ICD-10-CM | POA: Diagnosis not present

## 2021-02-10 DIAGNOSIS — F411 Generalized anxiety disorder: Secondary | ICD-10-CM | POA: Diagnosis not present

## 2021-03-11 ENCOUNTER — Ambulatory Visit (INDEPENDENT_AMBULATORY_CARE_PROVIDER_SITE_OTHER): Payer: Commercial Managed Care - PPO | Admitting: Psychologist

## 2021-03-11 DIAGNOSIS — F321 Major depressive disorder, single episode, moderate: Secondary | ICD-10-CM | POA: Diagnosis not present

## 2021-03-11 DIAGNOSIS — F411 Generalized anxiety disorder: Secondary | ICD-10-CM | POA: Diagnosis not present

## 2021-03-19 NOTE — Progress Notes (Deleted)
BH MD/PA/NP OP Progress Note  03/19/2021 4:19 PM Keith Rogers  MRN:  [id number]  Chief Complaint:  HPI: *** Visit Diagnosis: No diagnosis found.  Past Psychiatric History: Please see initial evaluation for full details. I have reviewed the history. No updates at this time.     Past Medical History:  Past Medical History:  Diagnosis Date   Arthritis    Asthma    Cervical radicular pain    Depression    Hypertension    Obesity     Past Surgical History:  Procedure Laterality Date   APPENDECTOMY     CARPAL TUNNEL RELEASE Right 07/01/2020   Procedure: CARPAL TUNNEL RELEASE;  Surgeon: Donato Heinz, MD;  Location: [facility] ORS;  Service: Orthopedics;  Laterality: Right;   EPIDURAL BLOCK INJECTION     cervical.  is seen in pain clinic at [facility]    Family Psychiatric History: Please see initial evaluation for full details. I have reviewed the history. No updates at this time.     Family History:  Family History  Problem Relation Age of Onset   Healthy Mother    Cancer Father        agent orange    Social History:  Social History   Socioeconomic History   Marital status: Single    Spouse name: Not on file   Number of children: Not on file   Years of education: Not on file   Highest education level: Not on file  Occupational History    Comment: has been out of work since May  Tobacco Use   Smoking status: Former    Packs/day: 1.00    Types: Cigarettes    Quit date: 09/06/2019    Years since quitting: 1.5   Smokeless tobacco: Never  Vaping Use   Vaping Use: Never used  Substance and Sexual Activity   Alcohol use: Yes    Comment: occassionally   Drug use: Never   Sexual activity: Not on file  Other Topics Concern   Not on file  Social History Narrative   Patient lives with significant other.   He feels safe in his home.   Has been out of work since May due to his pain/medical issues   Social Determinants of Corporate investment banker Strain: Not on file   Food Insecurity: Not on file  Transportation Needs: Not on file  Physical Activity: Not on file  Stress: Not on file  Social Connections: Not on file    Allergies: No Known Allergies  Metabolic Disorder Labs: No results found for: HGBA1C, MPG No results found for: PROLACTIN No results found for: CHOL, TRIG, HDL, CHOLHDL, VLDL, LDLCALC No results found for: TSH  Therapeutic Level Labs: No results found for: LITHIUM No results found for: VALPROATE No components found for:  CBMZ  Current Medications: Current Outpatient Medications  Medication Sig Dispense Refill   albuterol (PROVENTIL HFA;VENTOLIN HFA) 108 (90 Base) MCG/ACT inhaler Inhale 1-2 puffs into the lungs every 6 (six) hours as needed for wheezing or shortness of breath. 1 Inhaler 0   diclofenac (VOLTAREN) 50 MG EC tablet Take 50 mg by mouth 2 (two) times daily.     DULoxetine (CYMBALTA) 60 MG capsule Take 1 capsule (60 mg total) by mouth daily. 30 capsule 1   gabapentin (NEURONTIN) 300 MG capsule Take 300 mg by mouth 3 (three) times daily.     hydrochlorothiazide (HYDRODIURIL) 12.5 MG tablet Take 12.5 mg by mouth in the morning and at bedtime.  Multiple Vitamins-Minerals (MULTIVITAMIN WITH MINERALS) tablet Take 1 tablet by mouth daily.     propranolol (INDERAL) 20 MG tablet Take 20 mg by mouth 2 (two) times daily.     Spacer/Aero-Holding Chambers (AEROCHAMBER PLUS) inhaler Use as instructed 1 each 2   traMADol-acetaminophen (ULTRACET) 37.5-325 MG tablet Take 1-2 tablets by mouth every 6 (six) hours as needed for severe pain.     No current facility-administered medications for this visit.     Musculoskeletal: Strength & Muscle Tone:  N/A Gait & Station:  N/A Patient leans: N/A  Psychiatric Specialty Exam: Review of Systems  There were no vitals taken for this visit.There is no height or weight on file to calculate BMI.  General Appearance: {Appearance:22683}  Eye Contact:  {BHH EYE CONTACT:22684}  Speech:   Clear and Coherent  Volume:  Normal  Mood:  {BHH MOOD:22306}  Affect:  {Affect (PAA):22687}  Thought Process:  Coherent  Orientation:  Full (Time, Place, and Person)  Thought Content: Logical   Suicidal Thoughts:  {ST/HT (PAA):22692}  Homicidal Thoughts:  {ST/HT (PAA):22692}  Memory:  Immediate;   Good  Judgement:  {Judgement (PAA):22694}  Insight:  {Insight (PAA):22695}  Psychomotor Activity:  Normal  Concentration:  Concentration: Good and Attention Span: Good  Recall:  Good  Fund of Knowledge: Good  Language: Good  Akathisia:  No  Handed:  Right  AIMS (if indicated): not done  Assets:  Communication Skills Desire for Improvement  ADL's:  Intact  Cognition: WNL  Sleep:  {BHH GOOD/FAIR/POOR:22877}   Screenings: PHQ2-9    Flowsheet Row Video Visit from 10/30/2020 in [facility]  PHQ-2 Total Score 3  PHQ-9 Total Score 14      Flowsheet Row Video Visit from 01/22/2021 in [facility] Video Visit from 11/27/2020 in [facility] Admission (Discharged) from 07/01/2020 in [facility]  C-SSRS RISK CATEGORY No Risk No Risk No Risk        Assessment and Plan:  Keith Rogers is a 60 y.o. year old male with a history of depression, anxiety, hypertension, chronic pain with cervical radicular pain s/p midline C7-T1 ESI, osteoarthritis, DDD with Lumbar spondylosis, who presents for follow up appointment for below.    1. Current moderate episode of major depressive disorder without prior episode [facility]) He continues to report depressive symptoms and an anxiety in the context of taking care of his mother with lung melanoma, relocation due to financial issues.  Other psychosocial stressors includes his sister who has alcohol use disorder, and financial strain while he is on L[employer].  Will uptitrate duloxetine to optimize treatment for depression.  Discussed potential risk of  hypertension.  He will continue to see a therapist.    # Insomnia Unchanged. He reports occasional middle insomnia, daytime fatigue.  His wife has not observed any snoring.  Will continue to monitor and make referral for sleep evaluation if needed in the future.    Plan Increase duloxetine 60 mg daily; he checks HBP daily Next appointment: 12/2 at 9:30 for 30 mins, video - gabapentin 300 mg three times a day, propranolol     The patient demonstrates the following risk factors for suicide: Chronic risk factors for suicide include: psychiatric disorder of depression, anxiety and chronic pain. Acute risk factors for suicide include: loss (financial, interpersonal, professional). Protective factors for this patient include: positive social support, coping skills, and hope for the future. Considering these factors, the overall suicide risk at this  point appears to be low. Patient is appropriate for outpatient follow up.    [name], MD 03/19/2021, 4:19 PM

## 2021-03-21 ENCOUNTER — Other Ambulatory Visit: Payer: Self-pay

## 2021-03-21 ENCOUNTER — Telehealth: Payer: Commercial Managed Care - PPO | Admitting: Psychiatry

## 2021-03-21 ENCOUNTER — Telehealth: Payer: Self-pay | Admitting: Psychiatry

## 2021-03-21 NOTE — Telephone Encounter (Signed)
Sent link for video visit through Epic. Patient did not sign in. Called the patient for appointment scheduled today. The patient did not answer the phone. Left voice message to contact the office ([phone]).   ?

## 2021-03-24 NOTE — Progress Notes (Signed)
Virtual Visit via Video Note  I connected with Keith Rogers on 03/26/21 at 10:30 AM EST by a video enabled telemedicine application and verified that I am speaking with the correct person using two identifiers.  Location: Patient: home Provider: office Persons participated in the visit- patient, provider    I discussed the limitations of evaluation and management by telemedicine and the availability of in person appointments. The patient expressed understanding and agreed to proceed.   I discussed the assessment and treatment plan with the patient. The patient was provided an opportunity to ask questions and all were answered. The patient agreed with the plan and demonstrated an understanding of the instructions.   The patient was advised to call back or seek an in-person evaluation if the symptoms worsen or if the condition fails to improve as anticipated.  I provided 21 minutes of non-face-to-face time during this encounter.   [name], MD    [facility] MD/PA/NP OP Progress Note  03/26/2021 10:59 AM Keith Rogers  MRN:  [id number]  Chief Complaint:  Chief Complaint   Follow-up; Depression    HPI:  This is a follow-up appointment for depression.  He states that he feels very depressed.  He talks about his brother in-law, who passed away.  He denies significant issues with this, stating that he knew it was coming.  He is still at the current place with a plan to move in to the other house when it is done for cleaning.  His mother is doing better.  He visits her at least every week.  He has significant back pain.  He feels overwhelmed and has worsening in anxiety when he was inquired whether he can return to work by [employer].  Although he started to work at age 60, and has worked for many years/many hours, he does not think he can return to work due to pain and his mood.  This is also stressful for him as he wants to work as before.  He had a good Thanksgiving with her mother.   He has depressive symptoms as in PHQ-9.  He denies SI.    He drinks to liquors on weekends at times.  He denies drug use. He has not noticed any difference since starting duloxetine for his mood or pain.  He is willing to try venlafaxine.  He is not on tramadol anymore.   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 I have reviewed the history. No updates at this time.     Past Medical History:  Past Medical History:  Diagnosis Date   Arthritis    Asthma    Cervical radicular pain    Depression    Hypertension    Obesity     Past Surgical History:  Procedure Laterality Date   APPENDECTOMY     CARPAL TUNNEL RELEASE Right 07/01/2020   Procedure: CARPAL TUNNEL RELEASE;  Surgeon: Donato Heinz, MD;  Location: [facility];  Service:  Orthopedics;  Laterality: Right;   EPIDURAL BLOCK INJECTION     cervical.  is seen in pain clinic at [facility]    Family Psychiatric History: Please see initial evaluation for full details. I have reviewed the history. No updates at this time.     Family History:  Family History  Problem Relation Age of Onset   Healthy Mother    Cancer Father        agent orange    Social History:  Social History   Socioeconomic History   Marital status: Single    Spouse name: Not on file   Number of children: Not on file   Years of education: Not on file   Highest education level: Not on file  Occupational History    Comment: has  been out of work since May  Tobacco Use   Smoking status: Former    Packs/day: 1.00    Types: Cigarettes    Quit date: 09/06/2019    Years since quitting: 1.5   Smokeless tobacco: Never  Vaping Use   Vaping Use: Never used  Substance and Sexual Activity   Alcohol use: Yes    Comment: occassionally   Drug use: Never   Sexual activity: Not on file  Other Topics Concern   Not on file  Social History Narrative   Patient lives with significant other.   He feels safe in his home.   Has been out of work since May due to his pain/medical issues   Social Determinants of Corporate investment banker Strain: Not on file  Food Insecurity: Not on file  Transportation Needs: Not on file  Physical Activity: Not on file  Stress: Not on file  Social Connections: Not on file    Allergies: No Known Allergies  Metabolic Disorder Labs: No results found for: HGBA1C, MPG No results found for: PROLACTIN No results found for: CHOL, TRIG, HDL, CHOLHDL, VLDL, LDLCALC No results found for: TSH  Therapeutic Level Labs: No results found for: LITHIUM No results found for: VALPROATE No components found for:  CBMZ  Current Medications: Current Outpatient Medications  Medication Sig Dispense Refill   DULoxetine (CYMBALTA) 30 MG capsule Take 1 capsule (30 mg total) by mouth daily for 7 days. 7 capsule 0   venlafaxine XR (EFFEXOR-XR) 37.5 MG 24 hr capsule Take 1 capsule (37.5 mg total) by mouth daily with breakfast for 7 days. 7 capsule 0   [START ON 04/02/2021] venlafaxine XR (EFFEXOR-XR) 75 MG 24 hr capsule Take 1 capsule (75 mg total) by mouth daily with breakfast. Start after completing 37.5 mg daily for one week 30 capsule 0   albuterol (PROVENTIL HFA;VENTOLIN HFA) 108 (90 Base) MCG/ACT inhaler Inhale 1-2 puffs into the lungs every 6 (six) hours as needed for wheezing or shortness of breath. 1 Inhaler 0   diclofenac (VOLTAREN) 50 MG EC tablet Take 50 mg by mouth 2 (two) times daily.      gabapentin (NEURONTIN) 300 MG capsule Take 300 mg by mouth 3 (three) times daily.     hydrochlorothiazide (HYDRODIURIL) 12.5 MG tablet Take 12.5 mg by mouth in the morning and at bedtime.     Multiple Vitamins-Minerals (MULTIVITAMIN WITH MINERALS) tablet Take 1 tablet by mouth daily.     propranolol (INDERAL) 20 MG tablet Take 20 mg by mouth 2 (two) times daily.     Spacer/Aero-Holding Chambers (AEROCHAMBER PLUS) inhaler Use as instructed 1 each 2   traMADol-acetaminophen (ULTRACET) 37.5-325 MG tablet Take 1-2 tablets by  mouth every 6 (six) hours as needed for severe pain.     No current facility-administered medications for this visit.     Musculoskeletal: Strength & Muscle Tone:  N/A Gait & Station:  N/A Patient leans: N/A  Psychiatric Specialty Exam: Review of Systems  Psychiatric/Behavioral:  Positive for decreased concentration, dysphoric mood and sleep disturbance. Negative for agitation, behavioral problems, confusion, hallucinations, self-injury and suicidal ideas. The patient is nervous/anxious. The patient is not hyperactive.   All other systems reviewed and are negative.  There were no vitals taken for this visit.There is no height or weight on file to calculate BMI.  General Appearance: Fairly Groomed  Eye Contact:  Good  Speech:  Clear and Coherent  Volume:  Normal  Mood:  Depressed  Affect:  Appropriate, Congruent, and down  Thought Process:  Coherent  Orientation:  Full (Time, Place, and Person)  Thought Content: Logical   Suicidal Thoughts:  No  Homicidal Thoughts:  No  Memory:  Immediate;   Good  Judgement:  Good  Insight:  Good  Psychomotor Activity:  Normal  Concentration:  Concentration: Good and Attention Span: Good  Recall:  Good  Fund of Knowledge: Good  Language: Good  Akathisia:  No  Handed:  Right  AIMS (if indicated): not done  Assets:  Communication Skills Desire for Improvement  ADL's:  Intact  Cognition: WNL  Sleep:  Poor    Screenings: PHQ2-9    Flowsheet Row Video Visit from 03/26/2021 in [facility] Video Visit from 10/30/2020 in [facility]  PHQ-2 Total Score 6 3  PHQ-9 Total Score 15 14      Flowsheet Row Video Visit from 01/22/2021 in [facility] Video Visit from 11/27/2020 in [facility] Admission (Discharged) from 07/01/2020 in [facility]  C-SSRS RISK CATEGORY No Risk No Risk No Risk        Assessment and Plan:  Keith Rogers is a 59 y.o. year old male with a history of depression, anxiety, hypertension, chronic pain with cervical radicular pain s/p midline C7-T1 ESI, osteoarthritis, DDD with Lumbar spondylosis, who presents for follow up appointment for below.    1. Current moderate episode of major depressive disorder without prior episode (HCC) There has been worsening in depressive symptoms in the context of being inquired of returning to work despite ongoing pain and his mood symptoms.  Other psychosocial stressors includes loss of his brother-in-law, taking care of his mother with lung melanoma, upcoming relocation due to financial issues, and his sister with alcohol use disorder.  He had limited benefit from up titration of duloxetine.  We will cross taper from duloxetine to venlafaxine to see if it is more effective for his mood symptoms.  Discussed potential risk of hypertension, headache, and serotonin syndrome.   # Insomnia Unchanged.  He reports occasional middle insomnia, daytime fatigue.  His wife has not observed any snoring.  Will continue to monitor and make referral for sleep evaluation if needed in the future.    This clinician has discussed the side effect associated with medication prescribed during this encounter. Please refer to notes in the previous encounters for more details.    Plan Decrease duloxetine 30 mg daily for one  week, then discontinue  Start venlafaxine 37.5 mg daily for one week, then 75 mg daily Next appointment: 1/10 at 2 PM for 30 mins, video - gabapentin 300 mg three times a day, propranolol   Medication-  lexapro, duloxetine, , gabapentin 300 mg three times a day, propranolol 20 mg twice a day for hypertension, anxiety     The patient demonstrates the following risk factors for suicide: Chronic risk factors for suicide include: psychiatric disorder of depression, anxiety and chronic pain. Acute risk factors for suicide include: loss (financial, interpersonal, professional). Protective factors for this patient include: positive social support, coping skills, and hope for the future. Considering these factors, the overall suicide risk at this point appears to be low. Patient is appropriate for outpatient follow up.    [name], MD 03/26/2021, 10:59 AM

## 2021-03-26 ENCOUNTER — Telehealth (INDEPENDENT_AMBULATORY_CARE_PROVIDER_SITE_OTHER): Payer: Commercial Managed Care - PPO | Admitting: Psychiatry

## 2021-03-26 ENCOUNTER — Other Ambulatory Visit: Payer: Self-pay

## 2021-03-26 ENCOUNTER — Encounter: Payer: Self-pay | Admitting: Psychiatry

## 2021-03-26 DIAGNOSIS — F321 Major depressive disorder, single episode, moderate: Secondary | ICD-10-CM | POA: Diagnosis not present

## 2021-03-26 MED ORDER — VENLAFAXINE HCL ER 37.5 MG PO CP24: 37.5000 mg | ORAL_CAPSULE | Freq: Every day | ORAL | 0 refills | Status: DC

## 2021-03-26 MED ORDER — DULOXETINE HCL 30 MG PO CPEP: 30.0000 mg | ORAL_CAPSULE | Freq: Every day | ORAL | 0 refills | Status: DC

## 2021-03-26 MED ORDER — VENLAFAXINE HCL ER 75 MG PO CP24: 75.0000 mg | ORAL_CAPSULE | Freq: Every day | ORAL | 0 refills | Status: DC

## 2021-03-26 NOTE — Patient Instructions (Addendum)
Decrease duloxetine 30 mg daily for one week, then discontinue  Start venlafaxine 37.5 mg daily for one week, then 75 mg daily Next appointment: 1/10 at 2 PM, video

## 2021-04-23 ENCOUNTER — Ambulatory Visit (INDEPENDENT_AMBULATORY_CARE_PROVIDER_SITE_OTHER): Payer: Commercial Managed Care - PPO | Admitting: Psychologist

## 2021-04-23 DIAGNOSIS — F411 Generalized anxiety disorder: Secondary | ICD-10-CM

## 2021-04-23 DIAGNOSIS — F321 Major depressive disorder, single episode, moderate: Secondary | ICD-10-CM | POA: Diagnosis not present

## 2021-04-23 NOTE — Progress Notes (Signed)
[facility] Counselor/Therapist Progress Note  Patient ID: Keith Rogers, MRN: [id number],    Date: 04/23/2021  Time Spent: 9:05 am to 9:34 am; total time: 29 minutes   This session was held via video webex teletherapy due to the coronavirus risk at this time. The patient consented to video teletherapy and was located at his home during this session. He is aware it is the responsibility of the patient to secure confidentiality on his end of the session. The provider was in a private home office for the duration of this session. Limits of confidentiality were discussed with the patient.   Treatment Type: Individual Therapy  Reported Symptoms: Grief symptoms related to the death of his sister.   Mental Status Exam: Appearance:  Casual     Behavior: Appropriate  Motor: NA  Speech/Language:  Normal Rate  Affect: Appropriate  Mood: normal  Thought process: normal  Thought content:   WNL  Sensory/Perceptual disturbances:   WNL  Orientation: oriented to person, place, and time/date  Attention: Good  Concentration: Good  Memory: WNL  Fund of knowledge:  Fair  Insight:   Fair  Judgment:  Fair  Impulse Control: Good   Risk Assessment: Danger to Self:  No Self-injurious Behavior: No Danger to Others: No Duty to Warn:no Physical Aggression / Violence:No  Access to Firearms a concern: No  Gang Involvement:No   Subjective: Patient began the session disclosing that his disability has been extended, which he was excited for. He stated that he is still concerned that he will have to return to work. Per the patient, he has not met with his medical providers yet to discuss concerns regarding returning to work. From there, he shared that his sister died on Apr 25, 2023. He spent the session processing thoughts and emotions related to her death. He explored and processed different ways to honor and remember his sister. Patient was appreciative of grief related resources. After  processing emotions relate to his sister, he denied any other needs. He asked to follow up. He denied suicidal and homicidal ideation.    Interventions:  Worked on developing a therapeutic relationship with the patient using active listening and reflective statements. Provided emotional support using empathy and validation. Praised patient regarding news for disability. Reflected on events since the last session. Normalized and validated expressed thoughts and emotions related to the death of his sister. Processed ways that patient could honor his sister. Processed expressed thoughts and emotions related to the death of his sister. Used socratic questions to assist the patient. Challenged some of the thoughts expressed. Provided psychoeducation about grief, Tear Soup, and Ted Talk. Assigned homework. Provided empathic statements. Assessed for suicidal and homicidal ideation.  Homework: Read Tear Soup and watch Clare Gandy Talk  Next Session: Review homework, emotional support. Discuss returning to work and giving up control. 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   Treatment Level: Outpatient  Goals Alleviate depressive symptoms Recognize, accept, and cope with depressive feelings Develop healthy thinking patterns Develop healthy interpersonal relationships Reduce overall frequency, intensity, and duration of anxiety Stabilize anxiety level wile increasing ability to function Enhance ability to effectively cope with full variety of stressors Learn and implement coping skills that result in a reduction of anxiety   Objectives target date for all  objectives is 12/20/2021 Verbalize an understanding of the cognitive, physiological, and behavioral  components of anxiety Learning and implement calming skills to reduce overall anxiety Verbalize an understanding of the role that cognitive biases play in excessive irrational worry and persistent anxiety symptoms Identify, challenge, and replace based fearful talk Learn and implement problem solving strategies Identify and engage in pleasant activities Learning and implement personal and interpersonal skills to reduce anxiety and improve interpersonal relationships Learn to accept limitations in life and commit to tolerating, rather than avoiding, unpleasant emotions while accomplishing meaningful goals Identify major life conflicts from the past and present that form the basis for present anxiety Maintain involvement in work, family, and social activities Reestablish a consistent sleep-wake cycle Cooperate with a medical evaluation  Cooperate with a medication evaluation by a physician Verbalize an accurate understanding of depression Verbalize an understanding of the treatment Identify and replace thoughts that support depression Learn and implement behavioral strategies Verbalize an understanding and resolution of current interpersonal problems Learn and implement problem solving and decision making skills Learn and implement conflict resolution skills to resolve interpersonal problems Verbalize an understanding of healthy and unhealthy emotions verbalize insight into how past relationships may be influence current experiences with depression Use mindfulness and acceptance strategies and increase value based behavior  Increase hopeful statements about the future.  Interventions Engage the patient in behavioral activation Use instruction, modeling, and role-playing to build the client's general social, communication, and/or conflict resolution skills Use Acceptance and Commitment Therapy to help client accept uncomfortable realities in order to accomplish value-consistent  goals Reinforce the client's insight into the role of his/her past emotional pain and present anxiety  Support the client in following through with work, family, and social activities Teach and implement sleep hygiene practices  Refer the patient to a physician for a psychotropic medication consultation Monito the clint's psychotropic medication compliance Discuss how anxiety typically involves excessive worry, various bodily expressions of tension, and avoidance of what is threatening that interact to maintain the problem  Teach the patient relaxation skills Assign the patient homework Discuss examples demonstrating that unrealistic worry overestimates the probability of threats and underestimates patient's ability  Assist the patient in analyzing his or her worries Help patient understand that avoidance is reinforcing  Consistent with treatment model, discuss how change in cognitive, behavioral, and interpersonal can help client alleviate depression CBT Behavioral activation help the client explore the relationship, nature of the dispute,  Help the client develop new interpersonal skills and relationships Conduct Problem so living therapy Teach conflict resolution skills Use a process-experiential approach Conduct TLDP Conduct ACT Evaluate need for psychotropic medication Monitor adherence to medication  The patient reviewed and agreed to the treatment plan on 01/02/2021   Conception Chancy, PsyD

## 2021-04-26 NOTE — Progress Notes (Signed)
Virtual Visit via Video Note  I connected with Keith Rogers on 04/29/21 at  2:00 PM EST by a video enabled telemedicine application and verified that I am speaking with the correct person using two identifiers.  Location: Patient: home Provider: office Persons participated in the visit- patient, provider    I discussed the limitations of evaluation and management by telemedicine and the availability of in person appointments. The patient expressed understanding and agreed to proceed.    I discussed the assessment and treatment plan with the patient. The patient was provided an opportunity to ask questions and all were answered. The patient agreed with the plan and demonstrated an understanding of the instructions.   The patient was advised to call back or seek an in-person evaluation if the symptoms worsen or if the condition fails to improve as anticipated.  I provided 13 minutes of non-face-to-face time during this encounter.   [name], MD    [facility] MD/PA/NP OP Progress Note  04/29/2021 2:33 PM Keith Rogers  MRN:  [id number]  Chief Complaint:  Chief Complaint   Follow-up; Depression    HPI:  This is a follow-up appointment for depression.  He states that his sister passed away on 04-24-23.  She had alcohol, smoking and breathing issues.  It was unexpected.  Although it was hard, he had good Christmas with his mother, who is doing better lately.  He feels good that she is now off oxygen.  He reports good relationship with his significant other.  He goes outside and move around when he has less pain.  He feels good that social security approved disability.  However, he tends to feel anxious when he thinks whether or not he needs to go back to work.  He has middle insomnia, which she attributes to pain.  He feels depressed at times.  He feels fatigued.  He has fair concentration.  He denies change in appetite or weight.  He denies SI.  He has not noticed any difference since  switching from duloxetine to Effexor.  He denies any side effect.  He is willing to try higher dose at this time.   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 Current moderate episode of major depressive disorder without prior episode (HCC)  F32.1 Ambulatory referral to Psychology      Past Psychiatric History: Please see initial evaluation for full details. I have reviewed the history. No updates at this time.     Past Medical History:  Past Medical History:  Diagnosis Date   Arthritis    Asthma    Cervical radicular pain    Depression    Hypertension    Obesity     Past Surgical History:  Procedure Laterality Date   APPENDECTOMY     CARPAL TUNNEL RELEASE Right 07/01/2020   Procedure: CARPAL TUNNEL RELEASE;  Surgeon: Donato Heinz, MD;  Location: [facility] ORS;  Service: Orthopedics;  Laterality: Right;   EPIDURAL BLOCK INJECTION     cervical.  is seen in pain clinic at [facility]    Family Psychiatric History: Please see initial evaluation for full details. I have reviewed the history. No updates at this time.     Family History:  Family History  Problem Relation Age of Onset   Healthy Mother    Cancer Father        agent orange    Social History:  Social History   Socioeconomic History   Marital status: Single    Spouse name: Not on file   Number of children: Not on file   Years of education: Not on file   Highest education level: Not on file  Occupational History    Comment: has been out of work since May  Tobacco Use   Smoking status: Former    Packs/day: 1.00    Types: Cigarettes    Quit date: 09/06/2019    Years since quitting: 1.6   Smokeless tobacco: Never  Vaping Use   Vaping Use: Never used  Substance  and Sexual Activity   Alcohol use: Yes    Comment: occassionally   Drug use: Never   Sexual activity: Not on file  Other Topics Concern   Not on file  Social History Narrative   Patient lives with significant other.   He feels safe in his home.   Has been out of work since May due to his pain/medical issues   Social Determinants of Corporate investment banker Strain: Not on file  Food Insecurity: Not on file  Transportation Needs: Not on file  Physical Activity: Not on file  Stress: Not on file  Social Connections: Not on file    Allergies: No Known Allergies  Metabolic Disorder Labs: No results found for: HGBA1C, MPG No results found for: PROLACTIN No results found for: CHOL, TRIG, HDL, CHOLHDL, VLDL, LDLCALC No results found for: TSH  Therapeutic Level Labs: No results found for: LITHIUM No results found for: VALPROATE No components found for:  CBMZ  Current Medications: Current Outpatient Medications  Medication Sig Dispense Refill   venlafaxine XR (EFFEXOR-XR) 150 MG 24 hr capsule Take 1 capsule (150 mg total) by mouth daily with breakfast. 30 capsule 1   albuterol (PROVENTIL HFA;VENTOLIN HFA) 108 (90 Base) MCG/ACT inhaler Inhale 1-2 puffs into the lungs every 6 (six) hours as needed for wheezing or shortness of breath. 1 Inhaler 0   diclofenac (VOLTAREN) 50 MG EC tablet Take 50 mg by mouth 2 (two) times daily.     gabapentin (NEURONTIN) 300 MG capsule Take 300 mg by mouth 3 (three) times daily.     hydrochlorothiazide (HYDRODIURIL) 12.5 MG tablet Take 12.5 mg by mouth in the morning and at bedtime.     Multiple Vitamins-Minerals (MULTIVITAMIN WITH MINERALS) tablet Take 1 tablet by mouth daily.     propranolol (INDERAL) 20 MG tablet Take 20 mg by mouth 2 (two) times daily.     Spacer/Aero-Holding Chambers (AEROCHAMBER PLUS) inhaler Use as instructed 1 each 2   No current facility-administered medications for this visit.     Musculoskeletal: Strength & Muscle  Tone:  N/A Gait & Station:  N/A Patient leans: N/A  Psychiatric Specialty Exam: Review of Systems  Psychiatric/Behavioral:  Positive for dysphoric mood and sleep disturbance. Negative for agitation, behavioral problems, confusion, decreased concentration, hallucinations, self-injury and suicidal ideas. The patient is nervous/anxious. The patient is not hyperactive.   All other systems reviewed and are negative.  There were no vitals taken for this visit.There is no height or weight on file to calculate BMI.  General Appearance: Fairly Groomed  Eye Contact:  Good  Speech:  Clear and Coherent  Volume:  Normal  Mood:  Depressed  Affect:  Appropriate, Congruent, and calm  Thought Process:  Coherent  Orientation:  Full (Time, Place, and Person)  Thought Content: Logical   Suicidal Thoughts:  No  Homicidal Thoughts:  No  Memory:  Immediate;   Good  Judgement:  Good  Insight:  Good  Psychomotor Activity:  Normal  Concentration:  Concentration: Good and Attention Span: Good  Recall:  Good  Fund of Knowledge: Good  Language: Good  Akathisia:  No  Handed:  Right  AIMS (if indicated): not done  Assets:  Communication Skills Desire for Improvement  ADL's:  Intact  Cognition: WNL  Sleep:  Poor   Screenings: PHQ2-9    Flowsheet Row Video Visit from 03/26/2021 [facility] Video Visit from 10/30/2020 in [facility]  PHQ-2 Total Score 6 3  PHQ-9 Total Score 15 14      Flowsheet Row Video [facility]22/2021 [facility] Video Visit from 11/27/2020 in [facility] Admission (Discharged) from 07/01/2020 in [facility]  C-SSRS RISK CATEGORY No Risk No Risk No Risk        Assessment and Plan:  Keith Rogers is a 61 y.o. year old male with a history of depression, anxiety, hypertension, chronic pain with cervical radicular pain s/p  midline C7-T1 ESI, osteoarthritis, DDD with Lumbar spondylosis, who presents for follow up appointment for below.   1. Current moderate episode of major depressive disorder without prior episode [facility](HCC) He continues to report depressive symptoms since the last visit.  Recent psychosocial stressors include his loss of his sister.  Other psychosocial stressors includes loss of his brother-in-law, taking care of his mother with lung melanoma, uncertainty about his work., and upcoming relocation due to financial issues.  Will do further up titration of venlafaxine to optimize treatment for depression.  Discussed potential risk of hypertension and headache.  He will greatly benefit from CBT; will make referral.    # Insomnia Unchanged.  He reports occasional middle insomnia, daytime fatigue.  His wife has not observed any snoring.  He is not interesting in sleep evaluation as he attributes this to pain.    This clinician has discussed the side effect associated with medication prescribed during this encounter. Please refer to notes in the previous encounters for more details.      Plan Increase venlafaxine 150 mg daily  Next appointment: 3/7 at 1 :20 for 20 mins, video - gabapentin 300 mg three times a day, propranolol     Medication- lexapro, duloxetine, , gabapentin 300 mg three times a day, propranolol 20 mg twice a day for hypertension, anxiety     The patient demonstrates the following risk factors for suicide: Chronic risk factors for suicide include: psychiatric disorder of depression, anxiety and chronic pain. Acute risk factors for suicide include: loss (financial, interpersonal, professional). Protective factors for this patient include: positive social support, coping skills, and hope for the future. Considering these factors, the overall suicide risk at this point appears to be low. Patient is appropriate for outpatient follow up.      Neysa Hottereina Felicite Zeimet, MD 04/29/2021, 2:33 PM

## 2021-04-29 ENCOUNTER — Telehealth (INDEPENDENT_AMBULATORY_CARE_PROVIDER_SITE_OTHER): Payer: Commercial Managed Care - PPO | Admitting: Psychiatry

## 2021-04-29 ENCOUNTER — Encounter: Payer: Self-pay | Admitting: Psychiatry

## 2021-04-29 ENCOUNTER — Other Ambulatory Visit: Payer: Self-pay

## 2021-04-29 DIAGNOSIS — F321 Major depressive disorder, single episode, moderate: Secondary | ICD-10-CM

## 2021-04-29 MED ORDER — VENLAFAXINE HCL ER 150 MG PO CP24: 150.0000 mg | ORAL_CAPSULE | Freq: Every day | ORAL | 1 refills | Status: DC

## 2021-04-29 NOTE — Patient Instructions (Signed)
Increase venlafaxine 150 mg daily  Next appointment: 3/7 at 1 :20

## 2021-05-30 ENCOUNTER — Ambulatory Visit: Payer: Commercial Managed Care - PPO | Admitting: Psychologist

## 2021-06-21 NOTE — Progress Notes (Deleted)
BH MD/PA/NP OP Progress Note ? ?06/21/2021 4:52 PM ?Keith Rogers  ?MRN:  [id number] ? ?Chief Complaint: No chief complaint on file. ? ?HPI: *** ?Visit Diagnosis: No diagnosis found. ? ?Past Psychiatric History: Please see initial evaluation for full details. I have reviewed the history. No updates at this time.  ?  ? ?Past Medical History:  ?Past Medical History:  ?Diagnosis Date  ? Arthritis   ? Asthma   ? Cervical radicular pain   ? Depression   ? Hypertension   ? Obesity   ?  ?Past Surgical History:  ?Procedure Laterality Date  ? APPENDECTOMY    ? CARPAL TUNNEL RELEASE Right 07/01/2020  ? Procedure: CARPAL TUNNEL RELEASE;  Surgeon: Donato Heinz, MD;  Location: [facility] OR[facility];  Service: Orthopedics;  Laterality: Right;  ? EPIDURAL BLOCK INJECTION    ? cervical.  is seen in pain clinic at [facility]  ? ? ?Family Psychiatric History: Please see initial evaluation for full details. I have reviewed the history. No updates at this time.  ?  ? ?Family History:  ?Family History  ?Problem Relation Age of Onset  ? Healthy Mother   ? Cancer Father   ?     [place]  ? ? ?Social History:  ?Social History  ? ?Socioeconomic History  ? Marital status: Single  ?  Spouse name: Not on file  ? Number of children: Not on file  ? Years of education: Not on file  ? Highest education level: Not on file  ?Occupational History  ?  Comment: has been out of work since May  ?Tobacco Use  ? Smoking status: Former  ?  Packs/day: 1.00  ?  Types: Cigarettes  ?  Quit date: 09/06/2019  ?  Years since quitting: 1.7  ? Smokeless tobacco: Never  ?Vaping Use  ? Vaping Use: Never used  ?Substance and Sexual Activity  ? Alcohol use: Yes  ?  Comment: occassionally  ? Drug use: Never  ? Sexual activity: Not on file  ?Other Topics Concern  ? Not on file  ?Social History Narrative  ? Patient lives with significant other.  ? He feels safe in his home.  ? Has been out of work since May due to his pain/medical issues  ? ?Social Determinants of Health  ? ?Financial  Resource Strain: Not on file  ?Food Insecurity: Not on file  ?Transportation Needs: Not on file  ?Physical Activity: Not on file  ?Stress: Not on file  ?Social Connections: Not on file  ? ? ?Allergies: No Known Allergies ? ?Metabolic Disorder Labs: ?No results found for: HGBA1C, MPG ?No results found for: PROLACTIN ?No results found for: CHOL, TRIG, HDL, CHOLHDL, VLDL, LDLCALC ?No results found for: TSH ? ?Therapeutic Level Labs: ?No results found for: LITHIUM ?No results found for: VALPROATE ?No components found for:  CBMZ ? ?Current Medications: ?Current Outpatient Medications  ?Medication Sig Dispense Refill  ? albuterol (PROVENTIL HFA;VENTOLIN HFA) 108 (90 Base) MCG/ACT inhaler Inhale 1-2 puffs into the lungs every 6 (six) hours as needed for wheezing or shortness of breath. 1 Inhaler 0  ? diclofenac (VOLTAREN) 50 MG EC tablet Take 50 mg by mouth 2 (two) times daily.    ? gabapentin (NEURONTIN) 300 MG capsule Take 300 mg by mouth 3 (three) times daily.    ? hydrochlorothiazide (HYDRODIURIL) 12.5 MG tablet Take 12.5 mg by mouth in the morning and at bedtime.    ? Multiple Vitamins-Minerals (MULTIVITAMIN WITH MINERALS) tablet Take 1 tablet by  mouth daily.    ? propranolol (INDERAL) 20 MG tablet Take 20 mg by mouth 2 (two) times daily.    ? Spacer/Aero-Holding Chambers (AEROCHAMBER PLUS) inhaler Use as instructed 1 each 2  ? venlafaxine XR (EFFEXOR-XR) 150 MG 24 hr capsule Take 1 capsule (150 mg total) by mouth daily with breakfast. 30 capsule 1  ? ?No current facility-administered medications for this visit.  ? ? ? ?Musculoskeletal: ?Strength & Muscle Tone:  N/A ?Gait & Station:  N/A ?Patient leans: N/A ? ?Psychiatric Specialty Exam: ?Review of Systems  ?There were no vitals taken for this visit.There is no height or weight on file to calculate BMI.  ?General Appearance: {Appearance:22683}  ?Eye Contact:  {[facility] EYE CONTACT:22684}  ?Speech:  Clear and Coherent  ?Volume:  Normal  ?Mood:  {[facility] MOOD:22306}  ?Affect:   {Affect (PAA):22687}  ?Thought Process:  Coherent  ?Orientation:  Full (Time, Place, and Person)  ?Thought Content: Logical   ?Suicidal Thoughts:  {ST/HT (PAA):22692}  ?Homicidal Thoughts:  {ST/HT (PAA):22692}  ?Memory:  Immediate;   Good  ?Judgement:  {Judgement (PAA):22694}  ?Insight:  {Insight (PAA):22695}  ?Psychomotor Activity:  Normal  ?Concentration:  Concentration: Good and Attention Span: Good  ?Recall:  Good  ?Fund of Knowledge: Good  ?Language: Good  ?Akathisia:  No  ?Handed:  Right  ?AIMS (if indicated): not done  ?Assets:  Communication Skills ?Desire for Improvement  ?ADL's:  Intact  ?Cognition: WNL  ?Sleep:  {[facility] GOOD/FAIR/POOR:22877}  ? ?Screenings: ?PHQ2-9   ? ?Flowsheet Row Video Visit from 03/26/2021 in [facility] Video Visit from 10/30/2020 in [facility]  ?PHQ-2 Total Score 6 3  ?PHQ-9 Total Score 15 14  ? ?  ? ?Flowsheet Row Video Visit from 01/22/2021 in [facility] Video Visit from 11/27/2020 in [facility] Admission (Discharged) from 07/01/2020 in [facility]  ?C-SSRS RISK CATEGORY No Risk No Risk No Risk  ? ?  ? ? ? ?Assessment and Plan:  ?Keith Rogers is a 61 y.o. year old male with a history of depression, anxiety, hypertension, chronic pain with cervical radicular pain s/p midline C7-T1 ESI, osteoarthritis, DDD with Lumbar spondylosis, who presents for follow up appointment for below.  ? ?  ?1. Current moderate episode of major depressive disorder without prior episode (HCC) ?He continues to report depressive symptoms since the last visit.  Recent psychosocial stressors include his loss of his sister.  Other psychosocial stressors includes loss of his brother-in-law, taking care of his mother with lung melanoma, uncertainty about his work., and upcoming relocation due to financial issues.  Will do further up titration of venlafaxine to  optimize treatment for depression.  Discussed potential risk of hypertension and headache.  He will greatly benefit from CBT; will make referral.  ?  ?# Insomnia ?Unchanged.  He reports occasional middle insomnia, daytime fatigue.  His wife has not observed any snoring.  He is not interesting in sleep evaluation as he attributes this to pain.  ?  ?This clinician has discussed the side effect associated with medication prescribed during this encounter. Please refer to notes in the previous encounters for more details.   ?  ?  ?Plan ?Increase venlafaxine 150 mg daily  ?Next appointment: 3/7 at 1 :20 for 20 mins, video ?- gabapentin 300 mg three times a day, propranolol ?  ?  ?Medication- lexapro, duloxetine, , gabapentin 300 mg three times a day, propranolol 20 mg twice a day  for hypertension, anxiety ?  ?  ?The patient demonstrates the following risk factors for suicide: Chronic risk factors for suicide include: psychiatric disorder of depression, anxiety and chronic pain. Acute risk factors for suicide include: loss (financial, interpersonal, professional). Protective factors for this patient include: positive social support, coping skills, and hope for the future. Considering these factors, the overall suicide risk at this point appears to be low. Patient is appropriate for outpatient follow up. ?  ? ? ?  ? ?Collaboration of Care: Collaboration of Care: {BH OP Collaboration of TJQZ:[id number]} ? ?Patient/Guardian was advised Release of Information must be obtained prior to any record release in order to collaborate their care with an outside provider. Patient/Guardian was advised if they have not already done so to contact the registration department to sign all necessary forms in order for [place] to release information regarding their care.  ? ?Consent: Patient/Guardian gives verbal consent for treatment and assignment of benefits for services provided during this visit. Patient/Guardian expressed understanding and  agreed to proceed.  ? ? ?[name], MD ?06/21/2021, 4:52 PM ? ?

## 2021-06-24 ENCOUNTER — Telehealth: Payer: Commercial Managed Care - PPO | Admitting: Psychiatry

## 2021-07-30 ENCOUNTER — Other Ambulatory Visit: Payer: Self-pay | Admitting: Psychiatry

## 2021-07-30 NOTE — Telephone Encounter (Signed)
Ordered refill of venlafaxine per request. Please contact the patient to make follow up appointment. I will not be able to prescribe any more refills without evaluation. °

## 2021-07-31 ENCOUNTER — Telehealth: Payer: Self-pay | Admitting: Psychiatry

## 2021-07-31 NOTE — Telephone Encounter (Signed)
Called patient to schedule appointment at providers request after refills were provided - no more refills without evaluation per provider. No answer. Left voicemail requesting return call. ?

## 2021-10-16 ENCOUNTER — Telehealth: Payer: Self-pay | Admitting: Psychiatry

## 2021-10-16 NOTE — Telephone Encounter (Signed)
Left voicemail requesting call back to schedule appointment with provider at provider's if additional refills are going to be needed.
